# Patient Record
Sex: Female | Born: 1960 | Race: Black or African American | Hispanic: No | Marital: Married | Smoking: Never smoker
Health system: Southern US, Community
[De-identification: ages and names within clinical notes are randomized; demographics above are authoritative.]

## PROBLEM LIST (undated history)

## (undated) DIAGNOSIS — E785 Hyperlipidemia, unspecified: Secondary | ICD-10-CM

## (undated) DIAGNOSIS — E079 Disorder of thyroid, unspecified: Secondary | ICD-10-CM

## (undated) DIAGNOSIS — E119 Type 2 diabetes mellitus without complications: Secondary | ICD-10-CM

## (undated) DIAGNOSIS — I10 Essential (primary) hypertension: Secondary | ICD-10-CM

## (undated) HISTORY — DX: Type 2 diabetes mellitus without complications: E11.9

## (undated) HISTORY — DX: Hyperlipidemia, unspecified: E78.5

## (undated) HISTORY — DX: Essential (primary) hypertension: I10

## (undated) HISTORY — DX: Disorder of thyroid, unspecified: E07.9

---

## 2014-12-09 ENCOUNTER — Ambulatory Visit (INDEPENDENT_AMBULATORY_CARE_PROVIDER_SITE_OTHER): Admitting: Family Medicine

## 2014-12-09 VITALS — BP 130/84 | HR 75 | Temp 97.9°F | Resp 16 | Ht 63.5 in | Wt 182.0 lb

## 2014-12-09 DIAGNOSIS — H01003 Unspecified blepharitis right eye, unspecified eyelid: Secondary | ICD-10-CM

## 2014-12-09 DIAGNOSIS — H00023 Hordeolum internum right eye, unspecified eyelid: Secondary | ICD-10-CM

## 2014-12-09 MED ORDER — AMOXICILLIN-POT CLAVULANATE 875-125 MG PO TABS: 1.0000 | ORAL_TABLET | Freq: Two times a day (BID) | ORAL | Status: DC

## 2014-12-09 MED ORDER — OFLOXACIN 0.3 % OP SOLN: 1.0000 [drp] | Freq: Four times a day (QID) | OPHTHALMIC | Status: DC

## 2014-12-09 NOTE — Patient Instructions (Signed)
Take the Augmentin (amoxicillin/clavulanate) one pill twice daily for infection  If the eye itself starts to get regular use the ofloxacin eyedrops 2 drops 4 times daily  Return or get checked by your doctor if not improving  Keep your appointment with your eye doctor to get your visual acuity of the right eye recheck.

## 2014-12-09 NOTE — Progress Notes (Signed)
Patient ID: Miranda Mccullough, female    DOB: 06/07/1960  Age: 54 y.o. MRN: [id number]  Chief Complaint  Patient presents with  . Facial Swelling    Right eye, redness/ onset 4 days    Subjective:   54 year old lady who has a history of having had styes several times her left eye. She has had 2 days of swelling and irritation of the right upper eyelid. It was worse this morning. There is matting of the eyelids this morning. No other major concerns. She is a "prediabetic" on metformin.  Current allergies, medications, problem list, past/family and social histories reviewed.  Objective:  BP 130/84 mmHg  Pulse 75  Temp(Src) 97.9 F (36.6 C) (Oral)  Resp 16  Ht 5' 3.5" (1.613 m)  Wt 182 lb (82.555 kg)  BMI 31.73 kg/m2  SpO2 97%  LMP 06/02/2014  No acute distress. Eyes are PERRLA. Reflex bilaterally. Her conjunctiva is noninjected. She has a stye on the inner aspect of the upper eyelid right eye. It is not draining yet. The whole eyelid is swollen and red.  Assessment & Plan:   Assessment: 1. Sty, internal, right   2. Blepharitis of right eye       Plan: Treat with oral antibiotics. If he gets rid down to the conjunctiva she is to start using a bike eyedrops which I do not think will be needed. She was instructed to follow-up with her eye doctor. Will acuity was diminished.   Meds ordered this encounter  Medications  . valsartan-hydrochlorothiazide (DIOVAN-HCT) 160-12.5 MG tablet    Sig: Take 1 tablet by mouth daily.  Marland Kitchen levothyroxine (SYNTHROID, LEVOTHROID) 150 MCG tablet    Sig: Take 150 mcg by mouth daily before breakfast.  . metFORMIN (GLUMETZA) 500 MG (MOD) 24 hr tablet    Sig: Take 500 mg by mouth daily with breakfast.  . cetirizine (ZYRTEC) 10 MG tablet    Sig: Take 10 mg by mouth daily.  Marland Kitchen aspirin 81 MG tablet    Sig: Take 81 mg by mouth daily.  Marland Kitchen amLODipine (NORVASC) 10 MG tablet    Sig: Take 10 mg by mouth 2 (two) times daily.  Marland Kitchen amoxicillin-clavulanate  (AUGMENTIN) 875-125 MG tablet    Sig: Take 1 tablet by mouth 2 (two) times daily.    Dispense:  20 tablet    Refill:  0  . ofloxacin (OCUFLOX) 0.3 % ophthalmic solution    Sig: Place 1 drop into the right eye 4 (four) times daily.    Dispense:  5 mL    Refill:  0         Patient Instructions  Take the Augmentin (amoxicillin/clavulanate) one pill twice daily for infection  If the eye itself starts to get regular use the ofloxacin eyedrops 2 drops 4 times daily  Return or get checked by your doctor if not improving  Keep your appointment with your eye doctor to get your visual acuity of the right eye recheck.     Return if symptoms worsen or fail to improve.   HOPPER,DAVID, MD 12/09/2014

## 2017-03-31 ENCOUNTER — Other Ambulatory Visit: Payer: Self-pay | Admitting: Family Medicine

## 2017-03-31 DIAGNOSIS — Z1231 Encounter for screening mammogram for malignant neoplasm of breast: Secondary | ICD-10-CM

## 2017-04-23 ENCOUNTER — Ambulatory Visit
Admission: RE | Admit: 2017-04-23 | Discharge: 2017-04-23 | Disposition: A | Discharge status: Home / Self Care | Source: Ambulatory Visit | Attending: Family Medicine | Admitting: Family Medicine

## 2017-04-23 DIAGNOSIS — Z1231 Encounter for screening mammogram for malignant neoplasm of breast: Secondary | ICD-10-CM | POA: Diagnosis not present

## 2017-05-04 ENCOUNTER — Inpatient Hospital Stay
Admission: RE | Admit: 2017-05-04 | Discharge: 2017-05-04 | Disposition: A | Discharge status: Home / Self Care | Payer: Self-pay | Source: Ambulatory Visit | Attending: *Deleted | Admitting: *Deleted

## 2017-05-04 ENCOUNTER — Other Ambulatory Visit: Payer: Self-pay | Admitting: *Deleted

## 2017-05-04 DIAGNOSIS — Z9289 Personal history of other medical treatment: Secondary | ICD-10-CM

## 2018-01-16 LAB — TSH: TSH: 0.2 — AB (ref <=5.90)

## 2018-03-29 ENCOUNTER — Ambulatory Visit (INDEPENDENT_AMBULATORY_CARE_PROVIDER_SITE_OTHER): Admitting: "Endocrinology

## 2018-03-29 ENCOUNTER — Encounter: Payer: Self-pay | Admitting: "Endocrinology

## 2018-03-29 VITALS — BP 138/84 | HR 79 | Ht 63.5 in | Wt 165.0 lb

## 2018-03-29 DIAGNOSIS — E890 Postprocedural hypothyroidism: Secondary | ICD-10-CM | POA: Diagnosis not present

## 2018-03-29 DIAGNOSIS — E039 Hypothyroidism, unspecified: Secondary | ICD-10-CM | POA: Insufficient documentation

## 2018-03-29 MED ORDER — LEVOTHYROXINE SODIUM 125 MCG PO TABS: 125.0000 ug | ORAL_TABLET | Freq: Every day | ORAL | 1 refills | Status: DC

## 2018-03-29 NOTE — Progress Notes (Signed)
Endocrinology Consult Note                                         03/29/2018, 3:16 PM   Miranda Mccullough is a 58 y.o.-year-old female patient being seen in consultation for hypothyroidism referred by Alisia Ferrari, NP.   Past Medical History:  Diagnosis Date  . Diabetes mellitus without complication (HCC)   . Hyperlipidemia   . Hypertension   . Thyroid disease    Past Surgical History:  Procedure Laterality Date  . CESAREAN SECTION     Social History   Socioeconomic History  . Marital status: Married    Spouse name: Not on file  . Number of children: Not on file  . Years of education: Not on file  . Highest education level: Not on file  Occupational History  . Not on file  Social Needs  . Financial resource strain: Not on file  . Food insecurity:    Worry: Not on file    Inability: Not on file  . Transportation needs:    Medical: Not on file    Non-medical: Not on file  Tobacco Use  . Smoking status: Never Smoker  . Smokeless tobacco: Never Used  Substance and Sexual Activity  . Alcohol use: Yes    Alcohol/week: 1.0 - 2.0 standard drinks    Types: 1 - 2 Standard drinks or equivalent per week  . Drug use: No  . Sexual activity: Not on file  Lifestyle  . Physical activity:    Days per week: Not on file    Minutes per session: Not on file  . Stress: Not on file  Relationships  . Social connections:    Talks on phone: Not on file    Gets together: Not on file    Attends religious service: Not on file    Active member of club or organization: Not on file    Attends meetings of clubs or organizations: Not on file    Relationship status: Not on file  Other Topics Concern  . Not on file  Social History Narrative  . Not on file   Outpatient Encounter Medications as of 03/29/2018  Medication Sig  . amLODipine (NORVASC) 10 MG tablet Take 10 mg by mouth 2 (two) times daily.  .  cetirizine (ZYRTEC) 10 MG tablet Take 10 mg by mouth daily.  Marland Kitchen levothyroxine (SYNTHROID, LEVOTHROID) 125 MCG tablet Take 1 tablet (125 mcg total) by mouth daily before breakfast.  . rosuvastatin (CRESTOR) 40 MG tablet Take 40 mg by mouth daily.  . sitaGLIPtin-metformin (JANUMET) 50-1000 MG tablet Take 1 tablet by mouth 2 (two) times daily with a meal.  . telmisartan-hydrochlorothiazide (MICARDIS HCT) 80-25 MG tablet Take 1 tablet by mouth daily.  . Vitamin D, Ergocalciferol, (DRISDOL) 1.25 MG (50000 UT) CAPS capsule Take 50,000 Units by mouth every 7 (seven) days.  . [DISCONTINUED] levothyroxine (SYNTHROID, LEVOTHROID) 100 MCG tablet Take 100 mcg by  mouth daily before breakfast.  . [DISCONTINUED] amoxicillin-clavulanate (AUGMENTIN) 875-125 MG tablet Take 1 tablet by mouth 2 (two) times daily.  . [DISCONTINUED] aspirin 81 MG tablet Take 81 mg by mouth daily.  . [DISCONTINUED] levothyroxine (SYNTHROID, LEVOTHROID) 150 MCG tablet Take 150 mcg by mouth daily before breakfast.  . [DISCONTINUED] metFORMIN (GLUMETZA) 500 MG (MOD) 24 hr tablet Take 500 mg by mouth daily with breakfast.  . [DISCONTINUED] ofloxacin (OCUFLOX) 0.3 % ophthalmic solution Place 1 drop into the right eye 4 (four) times daily.  . [DISCONTINUED] valsartan-hydrochlorothiazide (DIOVAN-HCT) 160-12.5 MG tablet Take 1 tablet by mouth daily.   No facility-administered encounter medications on file as of 03/29/2018.    ALLERGIES: Allergies  Allergen Reactions  . Septra [Sulfamethoxazole-Trimethoprim] Nausea And Vomiting   VACCINATION STATUS:  There is no immunization history on file for this patient.   HPI    Miranda Mccullough  is a patient with the above medical history. she was diagnosed  with hypothyroidism at approximate age of 30 years when she received I-131 thyroid ablation [place] Graves' [place] with subsequent initiation of thyroid hormone replacement.    -  she was given various doses of Synthroid over the years,  currently on 100 micrograms. she reports that prior to her last lab work in November 2019 she was on 150 mcg.  She did have symptoms including unintended weight loss, palpitations, tremors, heat intolerance.  She underwent lab work including TSH which was found to be suppressed.  Her subsequent thyroid hormone dose was lowered.   She denies palpitations, tremors, heat intolerance at this time.   She has family history of thyroid dysfunction in her siblings and maternal aunts.   -She reports compliance to her medication approximately 95% of the time. -He denies weight gain, constipation, cold intolerance. -Her most recent thyroid function test on January 16, 2018 showed suppressed TSH of 0.201, high normal free T4 of 1.77.  Pt denies feeling nodules in neck, hoarseness, dysphagia/odynophagia, SOB with lying down.  No recent use of iodine supplements.  I reviewed her chart and she also has a history of type 2 diabetes well controlled on Janumet.   ROS:  Constitutional: +  weight loss, no fatigue, + subjective hyperthermia, no subjective hypothermia Eyes: no blurry vision, no xerophthalmia ENT: no sore throat, no nodules palpated in throat, no dysphagia/odynophagia, no hoarseness Cardiovascular: no Chest Pain, no Shortness of Breath, + palpitations, no leg swelling Respiratory: no cough, no SOB Gastrointestinal: no Nausea/Vomiting/Diarhhea Musculoskeletal: no muscle/joint aches Skin: no rashes Neurological: no tremors, no numbness, no tingling, no dizziness Psychiatric: no depression, no anxiety   Physical Exam: BP 138/84   Pulse 79   Ht 5' 3.5" (1.613 m)   Wt 165 lb (74.8 kg)   LMP 06/02/2014   BMI 28.77 kg/m  Wt Readings from Last 3 Encounters:  03/29/18 165 lb (74.8 kg)  12/09/14 182 lb (82.6 kg)    Constitutional: + Over weight for height, not in acute distress, normal state of mind Eyes: PERRLA, EOMI, no exophthalmos ENT: moist mucous membranes, no thyromegaly, no  cervical lymphadenopathy Cardiovascular: normal precordial activity, Regular Rate and Rhythm, no Murmur/Rubs/Gallops Respiratory:  adequate breathing efforts, no gross chest deformity, Clear to auscultation bilaterally Gastrointestinal: abdomen soft, Non -tender, No distension, Bowel Sounds present Musculoskeletal: no gross deformities, strength intact in all four extremities Skin: moist, warm, no rashes Neurological: no tremor with outstretched hands, Deep tendon reflexes normal in all four extremities.    ASSESSMENT: 1. 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  I-131 therapy was administered at approximate age of 30 years and overseas hospital in [place].  On physical exam , patient  does not  have  gross goiter, thyroid nodules, or neck compression symptoms.  Her symptoms from iatrogenic thyrotoxicosis have largely subsided when her Synthroid  dose was lowered from 150mcg to 100 mcg. -Based on her current body weight, her levothyroxine dose should be around 125 mcg p.o. nightly. -I discussed and prescribed Synthroid 125 mcg p.o. every morning. - We discussed about correct intake of levothyroxine, at fasting, with water, separated by at least 30 minutes from breakfast, and separated by more than 4 hours from calcium, iron, multivitamins, acid reflux medications (PPIs). -Patient is made aware of the fact that thyroid hormone replacement is needed for life, dose to be adjusted by periodic monitoring of thyroid function tests. -Due to absence of clinical goiter, no need for thyroid ultrasound.  - Time spent with the patient: 35 minutes, of which >50% was spent in obtaining information about her symptoms, reviewing her previous labs, evaluations, and treatments, counseling her about her hypothyroidism, and developing a plan to confirm the diagnosis and long term treatment as necessary.  Miranda Mccullough participated in the  discussions, expressed understanding, and voiced agreement with the above plans.  All questions were answered to her satisfaction. she is encouraged to contact clinic should she have any questions or concerns prior to her return visit.  Return in about 3 months (around 06/28/2018) for Follow up with Pre-visit Labs.   He wishes to follow-up for her diabetes with her PMD.  Miranda LunchGebre Alyssamae Klinck, MD [facility] [address] [place], [place] [address] Phone: [phone]  Fax: [phone]   03/29/2018, 3:16 PM  This note was partially dictated with voice recognition software. Similar sounding words can be transcribed inadequately or may not  be corrected upon review.

## 2018-05-20 ENCOUNTER — Encounter: Payer: Self-pay | Admitting: *Deleted

## 2018-06-28 ENCOUNTER — Ambulatory Visit: Admitting: "Endocrinology

## 2018-07-20 ENCOUNTER — Ambulatory Visit

## 2018-10-04 ENCOUNTER — Ambulatory Visit (INDEPENDENT_AMBULATORY_CARE_PROVIDER_SITE_OTHER): Payer: Self-pay | Admitting: *Deleted

## 2018-10-04 ENCOUNTER — Other Ambulatory Visit: Payer: Self-pay

## 2018-10-04 DIAGNOSIS — Z86010 Personal history of colonic polyps: Secondary | ICD-10-CM

## 2018-10-04 MED ORDER — NA SULFATE-K SULFATE-MG SULF 17.5-3.13-1.6 GM/177ML PO SOLN: 1.0000 | Freq: Once | ORAL | 0 refills | Status: AC

## 2018-10-04 NOTE — Progress Notes (Signed)
Gastroenterology Pre-Procedure Review  Request Date: 10/04/2018 Requesting Physician: Althea Grimmer, NP @ [facility], Last TCS done Jan 2015 at [facility], [place], 3 polyps removed per pt  PATIENT REVIEW QUESTIONS: The patient responded to the following health history questions as indicated:    1. Diabetes Melitis: Yes 2. Joint replacements in the past 12 months: No 3. Major health problems in the past 3 months: Yes, diabetes management,elevated bs 4. Has an artificial valve or MVP: No 5. Has a defibrillator: No 6. Has been advised in past to take antibiotics in advance of a procedure like teeth cleaning: No 7. Family history of colon cancer: No  8. Alcohol Use: Yes, 2 to 3 glasses of wine per week 9. History of sleep apnea: No  10. History of coronary artery or other vascular stents placed within the last 12 months: No 11. History of any prior anesthesia complications: Yes, 30 years ago, stiffness in knees     MEDICATIONS & ALLERGIES:    Patient reports the following regarding taking any blood thinners:   Plavix? No  Aspirin? No Coumadin? No Brilinta? No Xarelto? No Eliquis? No Pradaxa? No Savaysa? No Effient? No  Patient confirms/reports the following medications:  Current Outpatient Medications  Medication Sig Dispense Refill  . amLODipine (NORVASC) 10 MG tablet Take 10 mg by mouth 2 (two) times daily.    . cetirizine (ZYRTEC) 10 MG tablet Take 10 mg by mouth daily.    Marland Kitchen levothyroxine (SYNTHROID, LEVOTHROID) 125 MCG tablet Take 1 tablet (125 mcg total) by mouth daily before breakfast. 90 tablet 1  . rosuvastatin (CRESTOR) 40 MG tablet Take 40 mg by mouth daily.    . sitaGLIPtin-metformin (JANUMET) 50-1000 MG tablet Take 1 tablet by mouth 2 (two) times daily with a meal.    . telmisartan-hydrochlorothiazide (MICARDIS HCT) 80-25 MG tablet Take 1 tablet by mouth daily.    . Vitamin D, Ergocalciferol, (DRISDOL) 1.25 MG (50000 UT) CAPS  capsule Take 50,000 Units by mouth every 7 (seven) days.     No current facility-administered medications for this visit.     Patient confirms/reports the following allergies:  Allergies  Allergen Reactions  . Septra [Sulfamethoxazole-Trimethoprim] Nausea And Vomiting    No orders of the defined types were placed in this encounter.   AUTHORIZATION INFORMATION Primary Insurance: Dyanne Iha,  [place] #: [id number] Pre-Cert / Josem Kaufmann required: Not required  SCHEDULE INFORMATION: Procedure has been scheduled as follows:  Date: 11/05/2018, Time: 10:30 Location: [facility] with Dr. Oneida Alar  This Gastroenterology Pre-Precedure Review Form is being routed to the following provider(s): Neil Crouch, PA-C

## 2018-10-04 NOTE — Patient Instructions (Signed)
Miranda Mccullough  1960-12-17 MRN: [id number]     Procedure Date: 11/05/2018 Time to register: 9:30 am Place to register: [facility] Procedure Time: 10:30 am Scheduled provider: Dr. Oneida Alar    PREPARATION FOR COLONOSCOPY WITH SUPREP BOWEL PREP KIT  Note: Suprep Bowel Prep Kit is a split-dose (2day) regimen. Consumption of BOTH 6-ounce bottles is required for a complete prep.  Please notify us immediately if you are diabetic, take iron supplements, or if you are on Coumadin or any other blood thinners.  Please hold the following medications: see letter                                                                                                                                                 1 DAY BEFORE PROCEDURE:  DATE: 11/04/2018 DAY: Thursday Continue clear liquids the entire day - NO SOLID FOOD.   Diabetic medications adjustments for today: see letter  At 6:00pm: Complete steps 1 through 4 below, using ONE (1) 6-ounce bottle, before going to bed. Step 1:  Pour ONE (1) 6-ounce bottle of SUPREP liquid into the mixing container.  Step 2:  Add cool drinking water to the 16 ounce line on the container and mix.  Note: Dilute the solution concentrate as directed prior to use. Step 3:  DRINK ALL the liquid in the container. Step 4:  You MUST drink an additional two (2) or more 16 ounce containers of water over the next one (1) hour.   Continue clear liquids.  DAY OF PROCEDURE:   DATE: 11/05/2018   DAY: Friday If you take medications for your heart, blood pressure, or breathing, you may take these medications.  Diabetic medications adjustments for today: see letter  5 hours before your procedure at :  5:30 am Step 1:  Pour ONE (1) 6-ounce bottle of SUPREP liquid into the mixing container.  Step 2:  Add cool drinking water to the 16 ounce line on the container and mix.  Note: Dilute the solution concentrate as directed prior to use. Step 3:  DRINK ALL the liquid in the  container. Step 4:  You MUST drink an additional two (2) or more 16 ounce containers of water over the next one (1) hour. You MUST complete the final glass of water at least 3 hours before your colonoscopy. Nothing by mouth past 7:30 am  You may take your morning medications with sip of water unless we have instructed otherwise.    Please see below for Dietary Information.  CLEAR LIQUIDS INCLUDE:  Water Jello (NOT red in color)   Ice Popsicles (NOT red in color)   Tea (sugar ok, no milk/cream) Powdered fruit flavored drinks  Coffee (sugar ok, no milk/cream) Gatorade/ Lemonade/ Kool-Aid  (NOT red in color)   Juice: apple, white grape, white cranberry Soft drinks  Clear bullion, consomme, broth (fat  free beef/chicken/vegetable)  Carbonated beverages (any kind)  Strained chicken noodle soup Hard Candy   Remember: Clear liquids are liquids that will allow you to see your fingers on the other side of a clear glass. Be sure liquids are NOT red in color, and not cloudy, but CLEAR.  DO NOT EAT OR DRINK ANY OF THE FOLLOWING:  Dairy products of any kind   Cranberry juice Tomato juice / V8 juice   Grapefruit juice Orange juice     Red grape juice  Do not eat any solid foods, including such foods as: cereal, oatmeal, yogurt, fruits, vegetables, creamed soups, eggs, bread, crackers, pureed foods in a blender, etc.   HELPFUL HINTS FOR DRINKING PREP SOLUTION:   Make sure prep is extremely cold. Mix and refrigerate the the morning of the prep. You may also put in the freezer.   You may try mixing some Crystal Light or Country Time Lemonade if you prefer. Mix in small amounts; add more if necessary.  Try drinking through a straw  Rinse mouth with water or a mouthwash between glasses, to remove after-taste.  Try sipping on a cold beverage /ice/ popsicles between glasses of prep.  Place a piece of sugar-free hard candy in mouth between glasses.  If you become nauseated, try consuming smaller  amounts, or stretch out the time between glasses. Stop for 30-60 minutes, then slowly start back drinking.     OTHER INSTRUCTIONS  You will need a responsible adult at least 59 years of age to accompany you and drive you home. This person must remain in the waiting room during your procedure. The hospital will cancel your procedure if you do not have a responsible adult with you.   1. Wear loose fitting clothing that is easily removed. 2. Leave jewelry and other valuables at home.  3. Remove all body piercing jewelry and leave at home. 4. Total time from sign-in until discharge is approximately 2-3 hours. 5. You should go home directly after your procedure and rest. You can resume normal activities the day after your procedure. 6. The day of your procedure you should not:  Drive  Make legal decisions  Operate machinery  Drink alcohol  Return to work   You may call the office (Dept: [phone]) before 5:00pm, or page the doctor on call [phone]) after 5:00pm, for further instructions, if necessary.   Insurance Information YOU WILL NEED TO CHECK WITH YOUR INSURANCE COMPANY FOR THE BENEFITS OF COVERAGE YOU HAVE FOR THIS PROCEDURE.  UNFORTUNATELY, NOT ALL INSURANCE COMPANIES HAVE BENEFITS TO COVER ALL OR PART OF THESE TYPES OF PROCEDURES.  IT IS YOUR RESPONSIBILITY TO CHECK YOUR BENEFITS, HOWEVER, WE WILL BE GLAD TO ASSIST YOU WITH ANY CODES YOUR INSURANCE COMPANY MAY NEED.    PLEASE NOTE THAT MOST INSURANCE COMPANIES WILL NOT COVER A SCREENING COLONOSCOPY FOR PEOPLE UNDER THE AGE OF 50  IF YOU HAVE BCBS INSURANCE, YOU MAY HAVE BENEFITS FOR A SCREENING COLONOSCOPY BUT IF POLYPS ARE FOUND THE DIAGNOSIS WILL CHANGE AND THEN YOU MAY HAVE A DEDUCTIBLE THAT WILL NEED TO BE MET. SO PLEASE MAKE SURE YOU CHECK YOUR BENEFITS FOR A SCREENING COLONOSCOPY AS WELL AS A DIAGNOSTIC COLONOSCOPY.

## 2018-10-06 NOTE — Progress Notes (Signed)
  OK to schedule.  Day before TCS: Janumet 1/2 tab bid AM of TCS: Hold Janumet

## 2018-10-07 ENCOUNTER — Encounter: Payer: Self-pay | Admitting: *Deleted

## 2018-10-07 NOTE — Addendum Note (Signed)
Addended by: Metro Kung on: 10/07/2018 03:36 PM   Modules accepted: Orders, SmartSet

## 2018-10-07 NOTE — Progress Notes (Signed)
Mailed letter to pt about diabetes medication adjustments.

## 2018-11-02 ENCOUNTER — Other Ambulatory Visit (HOSPITAL_COMMUNITY)
Admission: RE | Admit: 2018-11-02 | Discharge: 2018-11-02 | Disposition: A | Discharge status: Home / Self Care | Source: Ambulatory Visit | Attending: Gastroenterology | Admitting: Gastroenterology

## 2018-11-02 ENCOUNTER — Other Ambulatory Visit: Payer: Self-pay

## 2018-11-02 DIAGNOSIS — Z20828 Contact with and (suspected) exposure to other viral communicable diseases: Secondary | ICD-10-CM | POA: Insufficient documentation

## 2018-11-02 DIAGNOSIS — Z01812 Encounter for preprocedural laboratory examination: Secondary | ICD-10-CM | POA: Diagnosis not present

## 2018-11-02 LAB — SARS CORONAVIRUS 2 (TAT 6-24 HRS): SARS Coronavirus 2: NEGATIVE

## 2018-11-03 ENCOUNTER — Other Ambulatory Visit (HOSPITAL_COMMUNITY)

## 2018-11-03 NOTE — Progress Notes (Signed)
CC'D TO PCP °

## 2018-11-04 ENCOUNTER — Telehealth: Payer: Self-pay | Admitting: *Deleted

## 2018-11-04 ENCOUNTER — Other Ambulatory Visit (HOSPITAL_COMMUNITY): Admission: RE | Admit: 2018-11-04 | Source: Ambulatory Visit

## 2018-11-04 NOTE — Telephone Encounter (Signed)
Pt would like you to call her back at [phone].

## 2018-11-04 NOTE — Telephone Encounter (Signed)
Pt is aware of results of Covid ( negative).

## 2018-11-05 ENCOUNTER — Other Ambulatory Visit: Payer: Self-pay

## 2018-11-05 ENCOUNTER — Encounter (HOSPITAL_COMMUNITY): Payer: Self-pay | Admitting: Emergency Medicine

## 2018-11-05 ENCOUNTER — Ambulatory Visit (HOSPITAL_COMMUNITY)
Admission: RE | Admit: 2018-11-05 | Discharge: 2018-11-05 | Disposition: A | Discharge status: Home / Self Care | Attending: Gastroenterology | Admitting: Gastroenterology

## 2018-11-05 ENCOUNTER — Encounter (HOSPITAL_COMMUNITY)
Admission: RE | Disposition: A | Discharge status: Home / Self Care | Payer: Self-pay | Source: Home / Self Care | Attending: Gastroenterology

## 2018-11-05 DIAGNOSIS — Z1211 Encounter for screening for malignant neoplasm of colon: Secondary | ICD-10-CM | POA: Insufficient documentation

## 2018-11-05 DIAGNOSIS — Z8249 Family history of ischemic heart disease and other diseases of the circulatory system: Secondary | ICD-10-CM | POA: Diagnosis not present

## 2018-11-05 DIAGNOSIS — Z79890 Hormone replacement therapy (postmenopausal): Secondary | ICD-10-CM | POA: Diagnosis not present

## 2018-11-05 DIAGNOSIS — E119 Type 2 diabetes mellitus without complications: Secondary | ICD-10-CM | POA: Diagnosis not present

## 2018-11-05 DIAGNOSIS — E785 Hyperlipidemia, unspecified: Secondary | ICD-10-CM | POA: Diagnosis not present

## 2018-11-05 DIAGNOSIS — K648 Other hemorrhoids: Secondary | ICD-10-CM | POA: Diagnosis not present

## 2018-11-05 DIAGNOSIS — K644 Residual hemorrhoidal skin tags: Secondary | ICD-10-CM | POA: Insufficient documentation

## 2018-11-05 DIAGNOSIS — I10 Essential (primary) hypertension: Secondary | ICD-10-CM | POA: Diagnosis not present

## 2018-11-05 DIAGNOSIS — Z79899 Other long term (current) drug therapy: Secondary | ICD-10-CM | POA: Insufficient documentation

## 2018-11-05 DIAGNOSIS — Z860100 Personal history of colon polyps, unspecified: Secondary | ICD-10-CM

## 2018-11-05 DIAGNOSIS — Z7984 Long term (current) use of oral hypoglycemic drugs: Secondary | ICD-10-CM | POA: Insufficient documentation

## 2018-11-05 DIAGNOSIS — K5730 Diverticulosis of large intestine without perforation or abscess without bleeding: Secondary | ICD-10-CM | POA: Diagnosis not present

## 2018-11-05 DIAGNOSIS — Z8719 Personal history of other diseases of the digestive system: Secondary | ICD-10-CM | POA: Diagnosis present

## 2018-11-05 DIAGNOSIS — Z882 Allergy status to sulfonamides status: Secondary | ICD-10-CM | POA: Diagnosis not present

## 2018-11-05 DIAGNOSIS — E079 Disorder of thyroid, unspecified: Secondary | ICD-10-CM | POA: Insufficient documentation

## 2018-11-05 HISTORY — PX: COLONOSCOPY: SHX5424

## 2018-11-05 LAB — GLUCOSE, CAPILLARY: Glucose-Capillary: 150 mg/dL — ABNORMAL HIGH (ref 70–99)

## 2018-11-05 SURGERY — COLONOSCOPY
Anesthesia: Moderate Sedation

## 2018-11-05 MED ORDER — MEPERIDINE HCL 100 MG/ML IJ SOLN
INTRAMUSCULAR | Status: AC
  Filled 2018-11-05: qty 2

## 2018-11-05 MED ORDER — STERILE WATER FOR IRRIGATION IR SOLN
Status: DC | PRN
  Administered 2018-11-05: 11:00:00 2.5 mL

## 2018-11-05 MED ORDER — SODIUM CHLORIDE 0.9 % IV SOLN
INTRAVENOUS | Status: DC
  Administered 2018-11-05: 10:00:00 via INTRAVENOUS

## 2018-11-05 MED ORDER — MIDAZOLAM HCL 5 MG/5ML IJ SOLN
INTRAMUSCULAR | Status: AC
  Filled 2018-11-05: qty 10

## 2018-11-05 MED ORDER — MEPERIDINE HCL 100 MG/ML IJ SOLN
INTRAMUSCULAR | Status: DC | PRN
  Administered 2018-11-05 (×2): 25 mg via INTRAVENOUS

## 2018-11-05 MED ORDER — MIDAZOLAM HCL 5 MG/5ML IJ SOLN
INTRAMUSCULAR | Status: DC | PRN
  Administered 2018-11-05 (×2): 2 mg via INTRAVENOUS
  Administered 2018-11-05: 1 mg via INTRAVENOUS

## 2018-11-05 NOTE — Discharge Instructions (Signed)
You DID NOT HAVE ANY POLYPS. YOU HAVE DIVERTICULOSIS IN YOUR TRANSVERSE COLON. You have internal AND EXTERNAL hemorrhoids.   DRINK WATER TO KEEP URINE LIGHT YELLOW.  FOLLOW A HIGH FIBER DIET. AVOID ITEMS THAT CAUSE BLOATING & GAS. SEE INFO BELOW.  IF YOU ARE INTERESTED IN GETTING OFF BP PILLS AND CHOLESTEROL/DIABETES MEDS, CONSIDER TRANSITIONING TO A DETOX DIET. THE BOOK IS AUTHORED BY DR. MARK HYMAN, "10 DAY DETOX DIET". EAT TO LIVE AND THINK OF FOOD AS MEDICINE. 75% OF YOUR PLATE SHOULD BE FRUITS/VEGGIES AND 25%OF YOUR PLATE SHOULD BE A PROTEIN SOURCE.  To have more energy and to REDUCE BLOOD PRESSURE AND NEED FOR CHOLESTEROL/DIABETES MEDS:      1. START TAKING A MULTIVITAMIN AND VITAMIN B12 DAILY. CONTINUE VITAMIN D3.    2. If you must eat bread, EAT EZEKIEL BREAD. IT IS IN THE FROZEN SECTION OF THE GROCERY STORE.    3. Do not drink SODA, GATORADE, ENERGY DRINKS, OR DIET SODA.     4. AVOID HIGH FRUCTOSE CORN SYRUP.     5. DO NOT chew SUGAR FREE GUM OR USE ARTIFICIAL SWEETENERS. USE STEVIA AS A SWEETENER.    6. DO NOT EAT ENRICHED WHEAT FLOUR, PASTA, RICE, OR CEREAL.    7. ONLY EAT WILD CAUGHT SEAFOOD, GRASS FED BEEF OR CHICKEN, FREE RANGE PORK, OR EGGS FROM PASTURE RAISED CHICKENS.    8. PRACTICE CHAIR YOGA FOR 15-30 MINS 3 OR 4 TIMES A WEEK AND PROGRESS TO HATHA YOGA OVER NEXT 6 MOS.   USE PREPARATION H FOUR TIMES  A DAY IF NEEDED TO RELIEVE RECTAL PAIN/PRESSURE/BLEEDING.   Next colonoscopy in 5 years.   Colonoscopy Care After Read the instructions outlined below and refer to this sheet in the next week. These discharge instructions provide you with general information on caring for yourself after you leave the hospital. While your treatment has been planned according to the most current medical practices available, unavoidable complications occasionally occur. If you have any problems or questions after discharge, call DR. Doye Montilla, [phone].  ACTIVITY  You may resume  your regular activity, but move at a slower pace for the next 24 hours.   Take frequent rest periods for the next 24 hours.   Walking will help get rid of the air and reduce the bloated feeling in your belly (abdomen).   No driving for 24 hours (because of the medicine (anesthesia) used during the test).   You may shower.   Do not sign any important legal documents or operate any machinery for 24 hours (because of the anesthesia used during the test).    NUTRITION  Drink plenty of fluids.   You may resume your normal diet as instructed by your doctor.   Begin with a light meal and progress to your normal diet. Heavy or fried foods are harder to digest and may make you feel sick to your stomach (nauseated).   Avoid alcoholic beverages for 24 hours or as instructed.    MEDICATIONS  You may resume your normal medications.   WHAT YOU CAN EXPECT TODAY  Some feelings of bloating in the abdomen.   Passage of more gas than usual.   Spotting of blood in your stool or on the toilet paper  .  IF YOU HAD POLYPS REMOVED DURING THE COLONOSCOPY:  Eat a soft diet IF YOU HAVE NAUSEA, BLOATING, ABDOMINAL PAIN, OR VOMITING.    FINDING OUT THE RESULTS OF YOUR TEST Not all test results are available during your  visit. DR. Oneida Alar WILL CALL YOU WITHIN 7 DAYS OF YOUR PROCEDUE WITH YOUR RESULTS. Do not assume everything is normal if you have not heard from DR. Finis Hendricksen IN ONE WEEK, CALL HER OFFICE AT [phone].  SEEK IMMEDIATE MEDICAL ATTENTION AND CALL THE OFFICE: [phone] IF:  You have more than a spotting of blood in your stool.   Your belly is swollen (abdominal distention).   You are nauseated or vomiting.   You have a temperature over 101F.   You have abdominal pain or discomfort that is severe or gets worse throughout the day.  High-Fiber Diet A high-fiber diet changes your normal diet to include more whole grains, legumes, fruits, and vegetables. Changes in the diet  involve replacing refined carbohydrates with unrefined foods. The calorie level of the diet is essentially unchanged. The Dietary Reference Intake (recommended amount) for adult males is 38 grams per day. For adult females, it is 25 grams per day. Pregnant and lactating women should consume 28 grams of fiber per day. Fiber is the intact part of a plant that is not broken down during digestion. Functional fiber is fiber that has been isolated from the plant to provide a beneficial effect in the body.  PURPOSE  Increase stool bulk.   Ease and regulate bowel movements.   Lower cholesterol.   REDUCE RISK OF COLON CANCER  INDICATIONS THAT YOU NEED MORE FIBER  Constipation and hemorrhoids.   Uncomplicated diverticulosis (intestine condition) and irritable bowel syndrome.   Weight management.   As a protective measure against hardening of the arteries (atherosclerosis), diabetes, and cancer.   GUIDELINES FOR INCREASING FIBER IN THE DIET  Start adding fiber to the diet slowly. A gradual increase of about 5 more grams (2 servings of most fruits or vegetables) per day is best. Too rapid an increase in fiber may result in constipation, flatulence, and bloating.   Drink enough water and fluids to keep your urine clear or pale yellow. Water, juice, or caffeine-free drinks are recommended. Not drinking enough fluid may cause constipation.   Eat a variety of high-fiber foods rather than one type of fiber.   Try to increase your intake of fiber through using high-fiber foods rather than fiber pills or supplements that contain small amounts of fiber.   The goal is to change the types of food eaten. Do not supplement your present diet with high-fiber foods, but replace foods in your present diet.     Diverticulosis Diverticulosis is a common condition that develops when small pouches (diverticula) form in the wall of the colon. The risk of diverticulosis increases with age. It happens more often  in people who eat a low-fiber diet. Most individuals with diverticulosis have no symptoms. Those individuals with symptoms usually experience belly (abdominal) pain, constipation, or loose stools (diarrhea).  HOME CARE INSTRUCTIONS  Increase the amount of fiber in your diet as directed by your caregiver or dietician. This may reduce symptoms of diverticulosis.   Drink at least 6 to 8 glasses of water each day to prevent constipation.   Try not to strain when you have a bowel movement.   THERE IS NO NEED TO Avoid nuts and seeds to prevent complications.   FOODS HAVING HIGH FIBER CONTENT INCLUDE:  Fruits. Apple, peach, pear, tangerine, raisins, prunes.   Vegetables. Brussels sprouts, asparagus, broccoli, cabbage, carrot, cauliflower, romaine lettuce, spinach, summer squash, tomato, winter squash, zucchini.   Starchy Vegetables. Baked beans, kidney beans, lima beans, split peas, lentils, potatoes (with skin).  Hemorrhoids Hemorrhoids are dilated (enlarged) veins around the rectum. Sometimes clots will form in the veins. This makes them swollen and painful. These are called thrombosed hemorrhoids. Causes of hemorrhoids include:  Constipation.   Straining to have a bowel movement.   HEAVY LIFTING  HOME CARE INSTRUCTIONS  Eat a well balanced diet and drink 6 to 8 glasses of water every day to avoid constipation. You may also use a bulk laxative.   Avoid straining to have bowel movements.   Keep anal area dry and clean.   Do not use a donut shaped pillow or sit on the toilet for long periods. This increases blood pooling and pain.   Move your bowels when your body has the urge; this will require less straining and will decrease pain and pressure.

## 2018-11-05 NOTE — H&P (Signed)
Primary Care Physician:  Alisia FerrariGill, Pushpinder K, NP Primary Gastroenterologist:  Dr. Darrick PennaFields  Pre-Procedure History & Physical: HPI:  Miranda Mccullough is a 58 y.o. female here for  PERSONAL HISTORY OF POLYPS.  Past Medical History:  Diagnosis Date  . Diabetes mellitus without complication (HCC)   . Hyperlipidemia   . Hypertension   . Thyroid disease     Past Surgical History:  Procedure Laterality Date  . CESAREAN SECTION      Prior to Admission medications   Medication Sig Start Date End Date Taking? Authorizing Provider  amLODipine (NORVASC) 10 MG tablet Take 10 mg by mouth 2 (two) times daily.   Yes [provider]  BLACK CURRANT SEED OIL PO Take 1 capsule by mouth daily.   Yes [provider]  cetirizine (ZYRTEC) 10 MG tablet Take 10 mg by mouth daily as needed for allergies.    Yes [provider]  levothyroxine (SYNTHROID) 100 MCG tablet Take 100 mcg by mouth daily before breakfast.   Yes [provider]  Polyethyl Glycol-Propyl Glycol (SYSTANE OP) Place 1 drop into both eyes daily as needed (dry eyes).   Yes [provider]  rosuvastatin (CRESTOR) 40 MG tablet Take 40 mg by mouth at bedtime.    Yes [provider]  SitaGLIPtin-MetFORMIN HCl (JANUMET XR) 50-1000 MG TB24 Take 1 tablet by mouth 2 (two) times daily.   Yes [provider]  telmisartan-hydrochlorothiazide (MICARDIS HCT) 80-25 MG tablet Take 1 tablet by mouth daily.   Yes [provider]  TURMERIC PO Take 1 capsule by mouth daily.   Yes [provider]  Vitamin D, Ergocalciferol, (DRISDOL) 1.25 MG (50000 UT) CAPS capsule Take 50,000 Units by mouth every Sunday.    Yes [provider]  levothyroxine (SYNTHROID, LEVOTHROID) 125 MCG tablet Take 1 tablet (125 mcg total) by mouth daily before breakfast. Patient not taking: Reported on 11/02/2018 03/29/18   Miranda KayserNida, [name], MD    Allergies as of 10/07/2018 - Review Complete 10/04/2018   Allergen Reaction Noted  . Septra [sulfamethoxazole-trimethoprim] Nausea And Vomiting 12/09/2014    Family History  Problem Relation Age of Onset  . Diabetes Mother   . Hyperlipidemia Mother   . Hypertension Mother   . Cancer Father   . Hyperlipidemia Father   . Hypertension Father   . Mental illness Father   . Heart disease Maternal Grandmother   . Mental illness Paternal Grandfather   . Breast cancer Neg Hx     Social History   Socioeconomic History  . Marital status: Married    Spouse name: Not on file  . Number of children: Not on file  . Years of education: Not on file  . Highest education level: Not on file  Occupational History  . Not on file  Social Needs  . Financial resource strain: Not on file  . Food insecurity    Worry: Not on file    Inability: Not on file  . Transportation needs    Medical: Not on file    Non-medical: Not on file  Tobacco Use  . Smoking status: Never Smoker  . Smokeless tobacco: Never Used  Substance and Sexual Activity  . Alcohol use: Yes    Alcohol/week: 1.0 - 2.0 standard drinks    Types: 1 - 2 Standard drinks or equivalent per week  . Drug use: No  . Sexual activity: Not on file  Lifestyle  . Physical activity    Days per week: Not on  file    Minutes per session: Not on file  . Stress: Not on file  Relationships  . Social Herbalist on phone: Not on file    Gets together: Not on file    Attends religious service: Not on file    Active member of club or organization: Not on file    Attends meetings of clubs or organizations: Not on file    Relationship status: Not on file  . Intimate partner violence    Fear of current or ex partner: Not on file    Emotionally abused: Not on file    Physically abused: Not on file    Forced sexual activity: Not on file  Other Topics Concern  . Not on file  Social History Narrative  . Not on file    Review of Systems: See HPI, otherwise negative ROS   Physical  Exam: BP (!) 138/95   Pulse 90   Temp 98.6 F (37 C) (Oral)   Resp 15   LMP 06/02/2014   SpO2 99%  General:   Alert,  pleasant and cooperative in NAD Head:  Normocephalic and atraumatic. Neck:  Supple; Lungs:  Clear throughout to auscultation.    Heart:  Regular rate and rhythm. Abdomen:  Soft, nontender and nondistended. Normal bowel sounds, without guarding, and without rebound.   Neurologic:  Alert and  oriented x4;  grossly normal neurologically.  Impression/Plan:      PERSONAL HISTORY OF POLYPS.  PLAN: 1. TCS TODAY. DISCUSSED PROCEDURE, BENEFITS, & RISKS: < 1% chance of medication reaction, bleeding, perforation, ASPIRATION, or rupture of spleen/liver requiring surgery to fix it and missed polyps < 1 cm 10-20% of the time.

## 2018-11-05 NOTE — Op Note (Signed)
[facility] Patient Name: Miranda NimrodMarceai Mccullough Procedure Date: 11/05/2018 10:47 AM MRN: [id number] Date of Birth: [birth year] Attending MD: Jonette EvaSandi Pia Jedlicka MD, MD CSN: [id number] Age: 58 Admit Type: Outpatient Procedure:                Colonoscopy, SURVEILLANCE Indications:              Personal history of colonic polyps Providers:                Jonette EvaSandi Ryken Paschal MD, MD, Nena PolioLisa Moore, RN, Pandora LeiterNeville David,                            Technician Referring MD:             Tana FeltsPushpinder Gill, NP Medicines:                Meperidine 50 mg IV, Midazolam 5 mg IV Complications:            No immediate complications. Estimated Blood Loss:     Estimated blood loss: none. Procedure:                Pre-Anesthesia Assessment:                           - Prior to the procedure, a History and Physical                            was performed, and patient medications and                            allergies were reviewed. The patient's tolerance of                            previous anesthesia was also reviewed. The risks                            and benefits of the procedure and the sedation                            options and risks were discussed with the patient.                            All questions were answered, and informed consent                            was obtained. Prior Anticoagulants: The patient has                            taken no previous anticoagulant or antiplatelet                            agents. ASA Grade Assessment: II - A patient with                            mild systemic disease. After reviewing the risks  and benefits, the patient was deemed in                            satisfactory condition to undergo the procedure.                            After obtaining informed consent, the colonoscope                            was passed under direct vision. Throughout the                            procedure, the patient's blood pressure, pulse, and                           oxygen saturations were monitored continuously. The                            [id number]-[id number] ([id number]) scope was introduced through                            the anus and advanced to the the cecum, identified                            by appendiceal orifice and ileocecal valve. The                            colonoscopy was somewhat difficult due to                            restricted mobility of the colon and a tortuous                            colon. Successful completion of the procedure was                            aided by increasing the dose of sedation                            medication, straightening and shortening the scope                            to obtain bowel loop reduction and COLOWRAP. The                            patient tolerated the procedure well. The ileocecal                            valve, appendiceal orifice, and rectum were                            photographed. The quality of the bowel preparation  was excellent. Scope In: 11:14:02 AM Scope Out: 11:26:40 AM Scope Withdrawal Time: 0 hours 10 minutes 27 seconds  Total Procedure Duration: 0 hours 12 minutes 38 seconds  Findings:      The recto-sigmoid colon, sigmoid colon and descending colon were mildly       tortuous.      Multiple small and large-mouthed diverticula were found in the       transverse colon.      External and internal hemorrhoids were found. Impression:               - Tortuous left colon.                           - TRANSVERSE COLON DIVERTICULOSIS                           - External AND INTERNAL HEMORRHOIDS. Moderate Sedation:      Moderate (conscious) sedation was administered by the endoscopy nurse       and supervised by the endoscopist. The following parameters were       monitored: oxygen saturation, heart rate, blood pressure, and response       to care. Total physician intraservice time was 25 minutes. Recommendation:            - Patient has a contact number available for                            emergencies. The signs and symptoms of potential                            delayed complications were discussed with the                            patient. Return to normal activities tomorrow.                            Written discharge instructions were provided to the                            patient.                           - Repeat colonoscopy in 5 years for surveillance.                           - High fiber diet. Change to detox diet.                           - START CHAIR YOGA. Procedure Code(s):        --- Professional ---                           [phone], Colonoscopy, flexible; diagnostic, including                            collection of specimen(s) by brushing or washing,  when performed (separate procedure)                           [id number], Moderate sedation; each additional 15                            minutes intraservice time                           G0500, Moderate sedation services provided by the                            same physician or other qualified health care                            professional performing a gastrointestinal                            endoscopic service that sedation supports,                            requiring the presence of an independent trained                            observer to assist in the monitoring of the                            patient's level of consciousness and physiological                            status; initial 15 minutes of intra-service time;                            patient age 58 years or older (additional time may                            be reported with [id number], as appropriate) Diagnosis Code(s):        --- Professional ---                           Z12.11, Encounter for screening for malignant                            neoplasm of colon                           K64.4, Residual  hemorrhoidal skin tags                           Q43.8, Other specified congenital malformations of                            intestine CPT copyright 2019 [employer]. All rights reserved. The codes documented in this report are preliminary and upon coder review may  be revised to meet current compliance requirements. Jonette EvaSandi Emmalyn Hinson, MD  Jonette Eva MD, MD 11/05/2018 1:29:29 PM This report has been signed electronically. Number of Addenda: 0

## 2018-11-11 ENCOUNTER — Encounter (HOSPITAL_COMMUNITY): Payer: Self-pay | Admitting: Gastroenterology

## 2018-11-25 ENCOUNTER — Ambulatory Visit (INDEPENDENT_AMBULATORY_CARE_PROVIDER_SITE_OTHER): Admitting: "Endocrinology

## 2018-11-25 ENCOUNTER — Other Ambulatory Visit: Payer: Self-pay

## 2018-11-25 ENCOUNTER — Encounter: Payer: Self-pay | Admitting: "Endocrinology

## 2018-11-25 DIAGNOSIS — E890 Postprocedural hypothyroidism: Secondary | ICD-10-CM | POA: Diagnosis not present

## 2018-11-25 MED ORDER — LEVOTHYROXINE SODIUM 100 MCG PO TABS: 100.0000 ug | ORAL_TABLET | Freq: Every day | ORAL | 1 refills | Status: DC

## 2018-11-25 NOTE — Progress Notes (Signed)
11/25/2018, 11:56 AM                                Endocrinology Telehealth Visit Follow up Note -During COVID -19 Pandemic  I connected with Miranda Mccullough on 11/25/2018   by telephone and verified that I am speaking with the correct person using two identifiers. Miranda Mccullough, 08/22/1960. she has verbally consented to this visit. All issues noted in this document were discussed and addressed. The format was not optimal for physical exam.   Miranda Mccullough is a 58 y.o.-year-old female patient being engaged in telehealth via telephone after she was seen in consultation for hypothyroidism. PMD:  Alisia FerrariGill, Pushpinder K, NP.   Past Medical History:  Diagnosis Date  . Diabetes mellitus without complication (HCC)   . Hyperlipidemia   . Hypertension   . Thyroid disease    Past Surgical History:  Procedure Laterality Date  . CESAREAN SECTION    . COLONOSCOPY N/A 11/05/2018   Procedure: COLONOSCOPY;  Surgeon: West BaliFields, Sandi L, MD;  [facility];  Service: Endoscopy;  Laterality: N/A;  10:30   Social History   Socioeconomic History  . Marital status: Married    Spouse name: Not on file  . Number of children: Not on file  . Years of education: Not on file  . Highest education level: Not on file  Occupational History  . Not on file  Social Needs  . Financial resource strain: Not on file  . Food insecurity    Worry: Not on file    Inability: Not on file  . Transportation needs    Medical: Not on file    Non-medical: Not on file  Tobacco Use  . Smoking status: Never Smoker  . Smokeless tobacco: Never Used  Substance and Sexual Activity  . Alcohol use: Yes    Alcohol/week: 1.0 - 2.0 standard drinks    Types: 1 - 2 Standard drinks or equivalent per week  . Drug use: No  . Sexual activity: Not on file  Lifestyle  . Physical activity    Days per week: Not on file   Minutes per session: Not on file  . Stress: Not on file  Relationships  . Social Musicianconnections    Talks on phone: Not on file    Gets together: Not on file    Attends religious service: Not on file    Active member of club or organization: Not on file    Attends meetings of clubs or organizations: Not on file    Relationship status: Not on file  Other Topics Concern  . Not on file  Social History Narrative  . Not on file   Outpatient Encounter Medications as of 11/25/2018  Medication Sig  . amLODipine (NORVASC) 10 MG tablet Take 10 mg  by mouth 2 (two) times daily.  Marland Kitchen BLACK CURRANT SEED OIL PO Take 1 capsule by mouth daily.  . cetirizine (ZYRTEC) 10 MG tablet Take 10 mg by mouth daily as needed for allergies.   Marland Kitchen levothyroxine (SYNTHROID) 100 MCG tablet Take 1 tablet (100 mcg total) by mouth daily before breakfast.  . Polyethyl Glycol-Propyl Glycol (SYSTANE OP) Place 1 drop into both eyes daily as needed (dry eyes).  . rosuvastatin (CRESTOR) 40 MG tablet Take 40 mg by mouth at bedtime.   . SitaGLIPtin-MetFORMIN HCl (JANUMET XR) 50-1000 MG TB24 Take 1 tablet by mouth 2 (two) times daily.  Marland Kitchen telmisartan-hydrochlorothiazide (MICARDIS HCT) 80-25 MG tablet Take 1 tablet by mouth daily.  . TURMERIC PO Take 1 capsule by mouth daily.  . Vitamin D, Ergocalciferol, (DRISDOL) 1.25 MG (50000 UT) CAPS capsule Take 50,000 Units by mouth every Sunday.   . [DISCONTINUED] levothyroxine (SYNTHROID) 100 MCG tablet Take 100 mcg by mouth daily before breakfast.   No facility-administered encounter medications on file as of 11/25/2018.    ALLERGIES: Allergies  Allergen Reactions  . Septra [Sulfamethoxazole-Trimethoprim] Nausea And Vomiting   VACCINATION STATUS:  There is no immunization history on file for this patient.   HPI    Miranda Mccullough  is a patient with the above medical history. she was diagnosed  with hypothyroidism at approximate age of 30 years when she received I-131 thyroid ablation  for Graves' disease in [place] with subsequent initiation of thyroid hormone replacement.    -  she was given various doses of Synthroid over the years, currently on 100 mcg p.o. daily before breakfast.  She reports compliance to this medication.   She denies palpitations, tremors, heat intolerance at this time.   She has family history of thyroid dysfunction in her siblings and maternal aunts.    -He denies weight gain, constipation, cold intolerance. -Her most recent thyroid function tests are consistent with appropriate replacement.   Pt denies feeling nodules in neck, hoarseness, dysphagia/odynophagia, SOB with lying down.  No recent use of iodine supplements.  I reviewed her chart and she also has a history of type 2 diabetes well controlled on Janumet.  Her most recent A1c is 6.8%.   ROS: Limited as above.  Physical Exam: LMP 06/02/2014  Wt Readings from Last 3 Encounters:  03/29/18 165 lb (74.8 kg)  12/09/14 182 lb (82.6 kg)     ASSESSMENT: 1. Hypothyroidism -RAI induced  PLAN:  -Her previsit thyroid function tests are consistent with appropriate replacement.  She is advised to continue her current dose of Synthroid 100 mcg p.o. daily before breakfast.  - We discussed about the correct intake of her thyroid hormone, on empty stomach at fasting, with water, separated by at least 30 minutes from breakfast and other medications,  and separated by more than 4 hours from calcium, iron, multivitamins, acid reflux medications (PPIs). -Patient is made aware of the fact that thyroid hormone replacement is needed for life, dose to be adjusted by periodic monitoring of thyroid function tests.  -Due to absence of clinical goiter, no need for thyroid ultrasound.    Return in about 6 months (around 05/25/2019) for Follow up with Pre-visit Labs.   He wishes to follow-up for her diabetes with her PMD.  Her recent labs show A1c of 6.8%.   Time for this visit: 15 minutes. Miranda Nimrod  participated in the discussions, expressed understanding, and voiced agreement with the above plans.  All questions were answered to her  satisfaction. she is encouraged to contact clinic should she have any questions or concerns prior to her return visit.  Glade Lloyd, MD [facility] [address] [place], [place] [address] Phone: [phone]  Fax: [phone]   11/25/2018, 11:56 AM  This note was partially dictated with voice recognition software. Similar sounding words can be transcribed inadequately or may not  be corrected upon review.

## 2018-12-14 ENCOUNTER — Other Ambulatory Visit: Payer: Self-pay

## 2018-12-14 MED ORDER — LEVOTHYROXINE SODIUM 100 MCG PO TABS: 100.0000 ug | ORAL_TABLET | Freq: Every day | ORAL | 1 refills | Status: DC

## 2019-03-14 ENCOUNTER — Other Ambulatory Visit (HOSPITAL_COMMUNITY): Payer: Self-pay | Admitting: Emergency Medicine

## 2019-03-14 DIAGNOSIS — Z1231 Encounter for screening mammogram for malignant neoplasm of breast: Secondary | ICD-10-CM

## 2019-05-04 ENCOUNTER — Other Ambulatory Visit: Payer: Self-pay

## 2019-05-04 ENCOUNTER — Ambulatory Visit (HOSPITAL_COMMUNITY)
Admission: RE | Admit: 2019-05-04 | Discharge: 2019-05-04 | Disposition: A | Discharge status: Home / Self Care | Source: Ambulatory Visit | Attending: Emergency Medicine | Admitting: Emergency Medicine

## 2019-05-04 DIAGNOSIS — Z1231 Encounter for screening mammogram for malignant neoplasm of breast: Secondary | ICD-10-CM | POA: Diagnosis present

## 2019-05-26 ENCOUNTER — Other Ambulatory Visit: Payer: Self-pay

## 2019-05-26 DIAGNOSIS — E890 Postprocedural hypothyroidism: Secondary | ICD-10-CM

## 2019-05-27 LAB — T4, FREE: Free T4: 1.4 ng/dL (ref 0.8–1.8)

## 2019-05-27 LAB — TSH: TSH: 2.4 mIU/L (ref 0.40–4.50)

## 2019-05-31 ENCOUNTER — Encounter: Payer: Self-pay | Admitting: "Endocrinology

## 2019-05-31 ENCOUNTER — Ambulatory Visit (INDEPENDENT_AMBULATORY_CARE_PROVIDER_SITE_OTHER): Admitting: "Endocrinology

## 2019-05-31 DIAGNOSIS — E890 Postprocedural hypothyroidism: Secondary | ICD-10-CM

## 2019-05-31 DIAGNOSIS — E1165 Type 2 diabetes mellitus with hyperglycemia: Secondary | ICD-10-CM | POA: Insufficient documentation

## 2019-05-31 NOTE — Patient Instructions (Signed)

## 2019-05-31 NOTE — Progress Notes (Signed)
05/31/2019, 11:23 AM                                    Endocrinology Telehealth Visit Follow up Note -During COVID -19 Pandemic  I connected with Miranda Mccullough on 05/31/2019   by telephone and verified that I am speaking with the correct person using two identifiers. Miranda Mccullough, November 13, 1960. she has verbally consented to this visit. All issues noted in this document were discussed and addressed. The format was not optimal for physical exam.  Miranda Mccullough is a 59 y.o.-year-old female patient being engaged in telehealth via telephone after she was seen in consultation for hypothyroidism. PMD:  Alisia Ferrari, NP.   Past Medical History:  Diagnosis Date  . Diabetes mellitus without complication (HCC)   . Hyperlipidemia   . Hypertension   . Thyroid disease    Past Surgical History:  Procedure Laterality Date  . CESAREAN SECTION    . COLONOSCOPY N/A 11/05/2018   Procedure: COLONOSCOPY;  Surgeon: West Bali, MD;  [facility];  Service: Endoscopy;  Laterality: N/A;  10:30   Social History   Socioeconomic History  . Marital status: Married    Spouse name: Not on file  . Number of children: Not on file  . Years of education: Not on file  . Highest education level: Not on file  Occupational History  . Not on file  Tobacco Use  . Smoking status: Never Smoker  . Smokeless tobacco: Never Used  Substance and Sexual Activity  . Alcohol use: Yes    Alcohol/week: 1.0 - 2.0 standard drinks    Types: 1 - 2 Standard drinks or equivalent per week  . Drug use: No  . Sexual activity: Not on file  Other Topics Concern  . Not on file  Social History Narrative  . Not on file   Social Determinants of Health   Financial Resource Strain:   . Difficulty of Paying Living Expenses:   Food Insecurity:   . Worried About Programme researcher, broadcasting/film/video in the Last Year:   .  Barista in the Last Year:   Transportation Needs:   . Freight forwarder (Medical):   Marland Kitchen Lack of Transportation (Non-Medical):   Physical Activity:   . Days of Exercise per Week:   . Minutes of Exercise per Session:   Stress:   . Feeling of Stress :   Social Connections:   . Frequency of Communication with Friends and Family:   . Frequency of Social Gatherings with Friends and Family:   . Attends Religious Services:   . Active Member of Clubs or Organizations:   . Attends Banker Meetings:   Marland Kitchen Marital Status:    Outpatient Encounter Medications as of 05/31/2019  Medication Sig  . amLODipine (NORVASC) 10 MG tablet Take 10 mg by mouth 2 (two) times daily.  Marland Kitchen BLACK CURRANT SEED OIL PO Take 1 capsule by mouth daily.  . cetirizine (ZYRTEC) 10 MG tablet Take 10 mg by mouth daily as needed for allergies.   Marland Kitchen levothyroxine (SYNTHROID) 100 MCG tablet Take 1 tablet (100 mcg total) by mouth daily before breakfast.  . Polyethyl Glycol-Propyl Glycol (SYSTANE OP) Place 1 drop into both eyes daily as needed (dry eyes).  . rosuvastatin (CRESTOR) 40 MG tablet Take 40 mg by mouth at bedtime.   . SitaGLIPtin-MetFORMIN HCl (JANUMET XR) 50-1000 MG TB24 Take 1 tablet by mouth 2 (two) times daily.  Marland Kitchen telmisartan-hydrochlorothiazide (MICARDIS HCT) 80-25 MG tablet Take 1 tablet by mouth daily.  . TURMERIC PO Take 1 capsule by mouth daily.  . Vitamin D, Ergocalciferol, (DRISDOL) 1.25 MG (50000 UT) CAPS capsule Take 50,000 Units by mouth every Sunday.    No facility-administered encounter medications on file as of 05/31/2019.   ALLERGIES: Allergies  Allergen Reactions  . Septra [Sulfamethoxazole-Trimethoprim] Nausea And Vomiting   VACCINATION STATUS:  There is no immunization history on file for this patient.   HPI    Miranda Mccullough  is a patient with the above medical history. she was diagnosed  with hypothyroidism at approximate age of 30 years when she received I-131  thyroid ablation for Graves' disease in [place] with subsequent initiation of thyroid hormone replacement.    -  she was given various doses of Synthroid over the years, currently on 100 mcg p.o. daily before breakfast.  She reports compliance to this medication.  She is being engaged in telehealth for follow-up.  He denies any new complaints today.   -Her most recent thyroid function tests are consistent with appropriate replacement.   Pt denies feeling nodules in neck, hoarseness, dysphagia/odynophagia, SOB with lying down.  No recent use of iodine supplements.  I reviewed her chart and she also has a history of type 2 diabetes well controlled on Janumet.  Her most recent A1c is 6.8%, she did not have subsequent A1c measurements.   ROS: Limited as above.  Physical Exam: LMP 06/02/2014  Wt Readings from Last 3 Encounters:  03/29/18 165 lb (74.8 kg)  12/09/14 182 lb (82.6 kg)     ASSESSMENT: 1. Hypothyroidism -RAI induced 2.  Type 2 diabetes-on Janumet 50/1000 mg p.o. daily , with most recent A1c of 6.8%  PLAN:  -Her previsit thyroid function tests are consistent with appropriate replacement.  She is advised to continue Synthroid 100 mcg p.o. daily before breakfast.    - We discussed about the correct intake of her thyroid hormone, on empty stomach at fasting, with water, separated by at least 30 minutes from breakfast and other medications,  and separated by more than 4 hours from calcium, iron, multivitamins, acid reflux medications (PPIs). -Patient is made aware of the fact that thyroid hormone replacement is needed for life, dose to be adjusted by periodic monitoring of thyroid function tests. -Due to absence of clinical goiter, no need for thyroid ultrasound.   Regarding her type 2 diabetes, she is advised to stay on Janumet 50/1000 mg p.o. daily.  She will have CMP, lipid panel prior to her next visit.  She will have A1c during her next visit.   Return in about 3 months  (around 08/31/2019) for Follow up with Pre-visit Labs, Next Visit A1c in Office.      - Time spent on this  patient care encounter:  25 minutes of which 50% was spent in  counseling and the rest reviewing  her current and  previous labs / studies and medications  doses and developing a plan for long term care. Cindra Eves  participated in the discussions, expressed understanding, and voiced agreement with the above plans.  All questions were answered to her satisfaction. she is encouraged to contact clinic should she have any questions or concerns prior to her return visit.   Glade Lloyd, MD [facility] [address] [place], [place] [address] Phone: [phone]  Fax: [phone]   05/31/2019, 11:23 AM  This note was partially dictated with voice recognition software. Similar sounding words can be transcribed inadequately or may not  be corrected upon review.

## 2019-08-04 ENCOUNTER — Other Ambulatory Visit: Payer: Self-pay

## 2019-08-04 ENCOUNTER — Encounter: Attending: "Endocrinology | Admitting: Nutrition

## 2019-08-04 VITALS — Ht 63.5 in | Wt 172.4 lb

## 2019-08-04 DIAGNOSIS — E1165 Type 2 diabetes mellitus with hyperglycemia: Secondary | ICD-10-CM

## 2019-08-04 NOTE — Progress Notes (Signed)
°  Medical Nutrition Therapy:  Appt start time: 1515 end time:  1615   Assessment:  Primary concerns today: Diabetes Type 2,  Wants to like to weigh 150-160's. Sees Dr. Fransico Him for thyroid and DM Type 2. Has had DM x 8 years.  Janumet 2-3 times per week. Suppose to take daily. 50/1000 of Janumet.  Walking every other day for 30 minutes a day. Uses the ellipicial for about 20-30 minutes at times. A1C 6.8%.  Preferred Learning Style:  No preference indicated   Learning Readiness:   Ready  Change in progress   MEDICATIONS:   DIETARY INTAKE:  24-hr recall:  Eats 3 meals per day usually   Usual physical activity: Walks and uses ellipical   Estimated energy needs: 1200  calories 135 g carbohydrates 90 g protein 33 g fat  Progress Towards Goal(s):  In progress.   Nutritional Diagnosis:  NB-1.1 Food and nutrition-related knowledge deficit As related to Diabetes Type 2.  As evidenced by A1C .    Intervention:  Nutrition and Diabetes education provided on My Plate, CHO counting, meal planning, portion sizes, timing of meals, avoiding snacks between meals unless having a low blood sugar, target ranges for A1C and blood sugars, signs/symptoms and treatment of hyper/hypoglycemia, monitoring blood sugars, taking medications as prescribed, benefits of exercising 30 minutes per day and prevention of complications of DM.  Marland KitchenGoals  Follow MY Plate Eat meals on time Avoid snacks Drink only water  Keep working out [employer] daily as prescribed. Check BS in am and occassionally in pm.    Teaching Method Utilized: Visual Auditory Hands on  Handouts given during visit include:  The Plate Method   Meal Plan Card  Diabetes Instructions.   Barriers to learning/adherence to lifestyle change: none  Demonstrated degree of understanding via:  Teach Back   Monitoring/Evaluation:  Dietary intake, exercise, , and body weight in 1 month(s).

## 2019-09-01 NOTE — Patient Instructions (Signed)
Goals  Follow MY Plate Eat meals on time Avoid snacks Drink only water  Keep working out [employer] daily as prescribed. Check BS in am and occassionally in pm.

## 2019-09-09 ENCOUNTER — Ambulatory Visit: Admitting: "Endocrinology

## 2019-09-19 ENCOUNTER — Ambulatory Visit: Admitting: Nutrition

## 2019-09-19 ENCOUNTER — Ambulatory Visit: Admitting: "Endocrinology

## 2019-09-30 ENCOUNTER — Other Ambulatory Visit: Payer: Self-pay | Admitting: "Endocrinology

## 2020-02-08 ENCOUNTER — Other Ambulatory Visit: Payer: Self-pay | Admitting: "Endocrinology

## 2020-06-19 ENCOUNTER — Other Ambulatory Visit (HOSPITAL_COMMUNITY): Payer: Self-pay | Admitting: Family Medicine

## 2020-06-19 DIAGNOSIS — Z1231 Encounter for screening mammogram for malignant neoplasm of breast: Secondary | ICD-10-CM

## 2020-06-25 ENCOUNTER — Ambulatory Visit (HOSPITAL_COMMUNITY)
Admission: RE | Admit: 2020-06-25 | Discharge: 2020-06-25 | Disposition: A | Discharge status: Home / Self Care | Source: Ambulatory Visit | Attending: Family Medicine | Admitting: Family Medicine

## 2020-06-25 DIAGNOSIS — Z1231 Encounter for screening mammogram for malignant neoplasm of breast: Secondary | ICD-10-CM | POA: Insufficient documentation

## 2020-07-25 ENCOUNTER — Other Ambulatory Visit: Payer: Self-pay

## 2020-07-25 ENCOUNTER — Ambulatory Visit (HOSPITAL_COMMUNITY): Attending: Physician Assistant

## 2020-07-25 DIAGNOSIS — M25511 Pain in right shoulder: Secondary | ICD-10-CM | POA: Insufficient documentation

## 2020-07-25 DIAGNOSIS — M6281 Muscle weakness (generalized): Secondary | ICD-10-CM | POA: Insufficient documentation

## 2020-07-25 DIAGNOSIS — R29898 Other symptoms and signs involving the musculoskeletal system: Secondary | ICD-10-CM | POA: Insufficient documentation

## 2020-07-25 DIAGNOSIS — G8929 Other chronic pain: Secondary | ICD-10-CM | POA: Diagnosis present

## 2020-07-25 NOTE — Patient Instructions (Signed)
Access Code: [email or web address] URL: [email or web address] Date: 07/25/2020 Prepared by: Shary Decamp  Exercises Seated Scapular Retraction - 1 x daily - 7 x weekly - 3 sets - 10 reps Shoulder External Rotation and Scapular Retraction with Resistance - 1 x daily - 7 x weekly - 3 sets - 10 reps Standing Shoulder Horizontal Abduction with Resistance - 1 x daily - 7 x weekly - 3 sets - 10 reps Corner Pec Major Stretch - 1 x daily - 7 x weekly - 3 sets - 3 reps - 3-60 sec hold

## 2020-07-25 NOTE — Therapy (Signed)
[facility] [address] [place], [place], [address] Phone: [phone]   Fax:  [phone]  Physical Therapy Evaluation  Patient Details  Name: Miranda Mccullough MRN: [id number] Date of Birth: [birth year] Referring Provider (PT): Darr, Celine Mans   Encounter Date: 07/25/2020   PT End of Session - 07/25/20 1430    Visit Number 1    Number of Visits 4    Date for PT Re-Evaluation 08/22/20    Authorization Type [employer] [place] for [employer], no auth, visit limit on medical necessity    PT Start Time 1345    PT Stop Time 1430    PT Time Calculation (min) 45 min    Activity Tolerance Patient tolerated treatment well    Behavior During Therapy [facility] for tasks assessed/performed           Past Medical History:  Diagnosis Date  . Diabetes mellitus without complication (HCC)   . Hyperlipidemia   . Hypertension   . Thyroid disease     Past Surgical History:  Procedure Laterality Date  . CESAREAN SECTION    . COLONOSCOPY N/A 11/05/2018   Procedure: COLONOSCOPY;  Surgeon: West Bali, MD;  [facility];  Service: Endoscopy;  Laterality: N/A;  10:30    There were no vitals filed for this visit.    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 Pt has been using meloxicam and has had some relief from this intervention    Currently in Pain? 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  No    Is the patient reluctant to leave their home because of a fear of falling?  No      Home Tourist information centre manager residence      Prior Function   Level of Independence Independent    Scientist, forensic work;Part time employment    Water engineer      Observation/Other Assessments   Focus on Therapeutic Outcomes (FOTO)  72.7% function      Posture/Postural Control   Posture/Postural Control Postural limitations    Postural Limitations Rounded Shoulders    Posture Comments 3-fingers breadth from posterior shoulder to exam table      ROM / Strength   AROM / PROM / Strength AROM;Strength      AROM   Overall AROM  Deficits    Overall AROM Comments pain with overhead end-range right shoulder motions. No pain provocation with c-spine ROM    AROM Assessment Site Cervical    Cervical Flexion WNL    Cervical Extension WNL    Cervical - Right Side Bend  WNL    Cervical - Left Side Bend WNL    Cervical - Right Rotation WNL    Cervical - Left Rotation WNL      Palpation   Palpation comment tenderness to palpation long head right biceps      Special Tests    Special Tests Rotator Cuff Impingement;Biceps/Labral Tests    Rotator Cuff Impingment tests Neer impingement test;Empty Can test    Biceps/Labral tests Speeds Test      Neer Impingement test    Findings Positive    Side Right      Empty Can test   Findings Positive    Side Right      Speeds test   findings Positive    Side Right                      Objective measurements completed on examination: See above findings.       [facility] Adult PT Treatment/Exercise - 07/25/20 0001      Exercises   Exercises Shoulder      Shoulder Exercises: Seated   Retraction Strengthening;20 reps    Horizontal ABduction Strengthening;Both;20 reps;Theraband    Theraband Level  (Shoulder Horizontal ABduction) Level 3 (Green)    External Rotation Strengthening;Both;20 reps    Theraband Level (Shoulder External Rotation) Level 3 (Green)      Shoulder Exercises: Stretch   Corner Stretch 2 reps;30 seconds                  PT Education - 07/25/20 1534    Education Details education on shoulder impingement vs cervical spine and education on monitoring symptoms to help identify underlying cause of RUE pain    Person(s) Educated Patient    Methods Explanation;Handout    Comprehension Verbalized understanding            PT Short Term Goals - 07/25/20 1538      PT SHORT TERM GOAL #1   Title Patient will be independent with HEP in order to improve functional outcomes.    Time 2    Period Weeks    Status New    Target Date 08/08/20      PT SHORT TERM GOAL #2   Title Patient will report at least 25% improvement in symptoms for improved quality of life.    Time 2    Period Weeks    Status New    Target Date 08/08/20      PT SHORT TERM GOAL #3   Title Demonstrate full, pain-free right shoulder AROM    Baseline painful arc    Time 2    Period Weeks    Status New    Target Date 08/08/20             PT Long Term Goals - 07/25/20 1539      PT LONG TERM GOAL #1   Title Patient will improve FOTO score by at least 5 points in order to indicate improved tolerance to activity.    Baseline 72% function    Time 4    Period Weeks    Status New    Target Date 08/22/20      PT LONG TERM GOAL #2   Title Demonstrate ability to lift 10 lbs overhead without pain    Baseline painful arc right shoulder    Time 4    Period Weeks    Status New    Target Date 08/22/20  Plan - 07/25/20 1535    Clinical Impression Statement Patient is a  59 yo lady presenting to physical therapy with c/o right shoulder pain. She presents with pain limited deficits in right UE strength, ROM, endurance, postural impairments, spinal mobility and  functional mobility with ADL. She is having to modify and restrict ADL as indicated by FOTO score as well as subjective information and objective measures which is affecting overall participation. Patient will benefit from skilled physical therapy in order to improve function and reduce impairment.    Personal Factors and Comorbidities Comorbidity 1;Time since onset of injury/illness/exacerbation    Comorbidities DM    Examination-Activity Limitations Carry;Lift;Reach Overhead    Examination-Participation Restrictions Cleaning;Community Activity;Laundry;Meal Prep    Stability/Clinical Decision Making Stable/Uncomplicated    Clinical Decision Making Low    Rehab Potential Excellent    PT Frequency 1x / week    PT Duration 4 weeks    PT Treatment/Interventions ADLs/Self Care Home Management;Aquatic Therapy;Cryotherapy;Electrical Stimulation;Ultrasound;Traction;Moist Heat;Iontophoresis 4mg /ml Dexamethasone;Gait training;Stair training;Functional mobility training;Therapeutic activities;Therapeutic exercise;Balance training;Patient/family education;Manual techniques;Passive range of motion;Taping;Dry needling;Spinal Manipulations;Joint Manipulations    PT Next Visit Plan right shoulder pain vs c-spine pain? Progress with postural re-education to reduce rounded shoulders    PT Home Exercise Plan scap retraction, ER and horzontal abd with green t-band, corner stretch    Consulted and Agree with Plan of Care Patient           Patient will benefit from skilled therapeutic intervention in order to improve the following deficits and impairments:  Decreased range of motion,Decreased strength,Postural dysfunction,Pain,Improper body mechanics,Impaired UE functional use  Visit Diagnosis: Chronic right shoulder pain  Muscle weakness (generalized)  Other symptoms and signs involving the musculoskeletal system     Problem List Patient Active Problem List   Diagnosis Date Noted  . Uncontrolled type 2  diabetes mellitus with hyperglycemia (HCC) 05/31/2019  . Hypothyroidism following radioiodine therapy 11/25/2018  . Personal history of colonic polyps   . Hypothyroidism 03/29/2018   3:42 PM, 07/25/20 M. 07/27/20, PT, DPT Physical Therapist- [place] Office Number: [phone]  [facility] [address] [place], [place], [place] Phone: [phone]   Fax:  [phone]  Name: Sheneika Walstad MRN: Sharyl Nimrod Date of Birth: [birth year]

## 2020-08-07 ENCOUNTER — Encounter (HOSPITAL_COMMUNITY)

## 2020-08-14 ENCOUNTER — Other Ambulatory Visit: Payer: Self-pay

## 2020-08-14 ENCOUNTER — Encounter (HOSPITAL_COMMUNITY)

## 2020-08-14 ENCOUNTER — Ambulatory Visit (HOSPITAL_COMMUNITY): Attending: Physician Assistant | Admitting: Physical Therapy

## 2020-08-14 ENCOUNTER — Encounter (HOSPITAL_COMMUNITY): Payer: Self-pay | Admitting: Physical Therapy

## 2020-08-14 DIAGNOSIS — G8929 Other chronic pain: Secondary | ICD-10-CM | POA: Diagnosis present

## 2020-08-14 DIAGNOSIS — M25511 Pain in right shoulder: Secondary | ICD-10-CM | POA: Insufficient documentation

## 2020-08-14 DIAGNOSIS — R29898 Other symptoms and signs involving the musculoskeletal system: Secondary | ICD-10-CM | POA: Diagnosis present

## 2020-08-14 DIAGNOSIS — M6281 Muscle weakness (generalized): Secondary | ICD-10-CM | POA: Insufficient documentation

## 2020-08-14 NOTE — Therapy (Signed)
[place], [place], [address] Phone: [phone]   Fax:  [phone]  Physical Therapy Treatment  Patient Details  Name: Miranda Mccullough MRN: [id number] Date of Birth: 1961-02-05 Referring Provider (PT): Darr, Louellen Molder   Encounter Date: 08/14/2020   PT End of Session - 08/14/20 0833     Visit Number 2    Number of Visits 4    Date for PT Re-Evaluation 08/22/20    Authorization Type [employer], no auth, visit limit on medical necessity    PT Start Time [phone]    PT Stop Time 0817    PT Time Calculation (min) 30 min    Activity Tolerance Patient tolerated treatment well    Behavior During Therapy [facility] for tasks assessed/performed             Past Medical History:  Diagnosis Date   Diabetes mellitus without complication ([place])    Hyperlipidemia    Hypertension    Thyroid disease     Past Surgical History:  Procedure Laterality Date   CESAREAN SECTION     COLONOSCOPY N/A 11/05/2018   Procedure: COLONOSCOPY;  Surgeon: Danie Binder, MD;  Location: [facility] ENDO SUITE;  Service: Endoscopy;  Laterality: N/A;  10:30    There were no vitals filed for this visit.       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,    Referring Provider (PT) Darr, Edison Nasuti, PA-C    Hand Dominance Right      Observation/Other Assessments   Focus on Therapeutic Outcomes (FOTO)  87% function      Functional Tests   Functional tests --   able to lift 10# weight overhead with both arms     AROM   Overall AROM Comments WFL of shoulder ROM in all directions and no pain    AROM Assessment Site Cervical    Cervical Flexion WNL    Cervical Extension WNL    Cervical - Right Side Bend WNL    Cervical - Left Side Bend WNL    Cervical - Right Rotation WNL    Cervical - Left Rotation WNL                           [facility]  Adult PT Treatment/Exercise - 08/14/20 0001       Shoulder Exercises: Standing   Protraction AROM;Both;15 reps    Theraband Level (Shoulder Protraction) Level 3 (Green)    Other Standing Exercises shoulder flexion then abd with shoulders set x15 bilaterl                    PT Education - 08/14/20 0833     Education Details on HEP, on FOTO score, on POC    Person(s) Educated Patient    Methods Explanation    Comprehension Verbalized understanding              PT Short Term Goals - 08/14/20 0753       PT SHORT TERM GOAL #1   Title Patient will be independent with HEP in order to improve functional outcomes.    Time 2    Period Weeks    Status Achieved    Target Date 08/08/20      PT SHORT TERM GOAL #2   Title Patient will report at  least 25% improvement in symptoms for improved quality of life.    Baseline 100% better    Time 2    Period Weeks    Status Achieved    Target Date 08/08/20      PT SHORT TERM GOAL #3   Title Demonstrate full, pain-free right shoulder AROM    Baseline painful arc    Time 2    Period Weeks    Status Achieved    Target Date 08/08/20               PT Long Term Goals - 08/14/20 0810       PT LONG TERM GOAL #1   Title Patient will improve FOTO score by at least 5 points in order to indicate improved tolerance to activity.    Baseline 72% function    Time 4    Period Weeks    Status Achieved      PT LONG TERM GOAL #2   Title Demonstrate ability to lift 10 lbs overhead without pain    Baseline painful arc right shoulder    Time 4    Period Weeks    Status Achieved                   Plan - 08/14/20 4174     Clinical Impression Statement all goals met at this time. Reviewed HEP and added 2 new exercises to HEP. Patient to DC from HEP at this time secondary to progress made.    Personal Factors and Comorbidities Comorbidity 1;Time since onset of injury/illness/exacerbation    Comorbidities DM     Examination-Activity Limitations Carry;Lift;Reach Overhead    Examination-Participation Restrictions Cleaning;Community Activity;Laundry;Meal Prep    Stability/Clinical Decision Making Stable/Uncomplicated    Rehab Potential Excellent    PT Frequency 1x / week    PT Duration 4 weeks    PT Treatment/Interventions ADLs/Self Care Home Management;Aquatic Therapy;Cryotherapy;Electrical Stimulation;Ultrasound;Traction;Moist Heat;Iontophoresis 66m/ml Dexamethasone;Gait training;Stair training;Functional mobility training;Therapeutic activities;Therapeutic exercise;Balance training;Patient/family education;Manual techniques;Passive range of motion;Taping;Dry needling;Spinal Manipulations;Joint Manipulations    PT Next Visit Plan DC to HEP    PT Home Exercise Plan scap retraction, ER and horzontal abd with green t-band, corner stretch, serratus punch, shoulder flexion adn then abd with shoulders set    Consulted and Agree with Plan of Care Patient             Patient will benefit from skilled therapeutic intervention in order to improve the following deficits and impairments:  Decreased range of motion, Decreased strength, Postural dysfunction, Pain, Improper body mechanics, Impaired UE functional use  Visit Diagnosis: Chronic right shoulder pain  Muscle weakness (generalized)  Other symptoms and signs involving the musculoskeletal system     Problem List Patient Active Problem List   Diagnosis Date Noted   Uncontrolled type 2 diabetes mellitus with hyperglycemia ([place] 05/31/2019   Hypothyroidism following radioiodine therapy 11/25/2018   Personal history of colonic polyps    Hypothyroidism 03/29/2018   8:34 AM, 08/14/20 MJerene Pitch DPT Physical [facility] [phone]   [id number] [address]Phone: [phone]  Fax:  [phone] Name: Miranda FambroughMRN:  [id number] of Birth: 510-Aug-1962

## 2020-08-21 ENCOUNTER — Encounter (HOSPITAL_COMMUNITY)

## 2020-08-28 ENCOUNTER — Encounter (HOSPITAL_COMMUNITY)

## 2020-09-04 ENCOUNTER — Encounter (HOSPITAL_COMMUNITY): Admitting: Physical Therapy

## 2021-01-29 ENCOUNTER — Ambulatory Visit (INDEPENDENT_AMBULATORY_CARE_PROVIDER_SITE_OTHER): Admitting: Podiatry

## 2021-01-29 ENCOUNTER — Other Ambulatory Visit: Payer: Self-pay

## 2021-01-29 DIAGNOSIS — S90211S Contusion of right great toe with damage to nail, sequela: Secondary | ICD-10-CM

## 2021-01-29 DIAGNOSIS — L603 Nail dystrophy: Secondary | ICD-10-CM | POA: Diagnosis not present

## 2021-01-29 NOTE — Progress Notes (Signed)
  Subjective:  Patient ID: Miranda Mccullough, female    DOB: May 08, 1960,  MRN: [id number]  Chief Complaint  Patient presents with   Nail Problem    R Abnormality of nail of toe     60 y.o. female presents with the above complaint. History confirmed with patient.  She damaged the toenail in May 2022 and the toenail has been growing out but recently started to crack and split  Objective:  Physical Exam: warm, good capillary refill, no trophic changes or ulcerative lesions, normal DP and PT pulses, and normal sensory exam. Left Foot: normal exam, no swelling, tenderness, instability; ligaments intact, full range of motion of all ankle/foot joints Right Foot: Right hallux with transverse Beau's line, splitting of distal lateral corner  Assessment:   1. Contusion of right great toe with damage to nail, sequela   2. Nail dystrophy      Plan:  Patient was evaluated and treated and all questions answered.  Nail that is growing in now appears to be healthy and growing normally.  The nail splitting at the area of the transverse Beau's line at the area of trauma.  I debrided the nail with a nail nipper and rotary bur to smooth it out.  Advised to continue to let it grow out.  If becomes ingrown or causes infection or significant pain would consider partial or total nail avulsion if necessary.  She will return to see me as needed for this or other issues  Return if symptoms worsen or fail to improve.

## 2021-09-24 ENCOUNTER — Emergency Department (HOSPITAL_COMMUNITY)
Admission: EM | Admit: 2021-09-24 | Discharge: 2021-09-24 | Disposition: A | Discharge status: Home / Self Care | Attending: Emergency Medicine | Admitting: Emergency Medicine

## 2021-09-24 ENCOUNTER — Encounter (HOSPITAL_COMMUNITY): Payer: Self-pay

## 2021-09-24 ENCOUNTER — Emergency Department (HOSPITAL_COMMUNITY)

## 2021-09-24 ENCOUNTER — Other Ambulatory Visit: Payer: Self-pay

## 2021-09-24 DIAGNOSIS — R0789 Other chest pain: Secondary | ICD-10-CM | POA: Diagnosis not present

## 2021-09-24 DIAGNOSIS — R079 Chest pain, unspecified: Secondary | ICD-10-CM | POA: Diagnosis present

## 2021-09-24 DIAGNOSIS — Z79899 Other long term (current) drug therapy: Secondary | ICD-10-CM | POA: Insufficient documentation

## 2021-09-24 LAB — CBC WITH DIFFERENTIAL/PLATELET
Abs Immature Granulocytes: 0.01 10*3/uL (ref 0.00–0.07)
Basophils Absolute: 0.1 10*3/uL (ref 0.0–0.1)
Basophils Relative: 2 %
Eosinophils Absolute: 0.5 10*3/uL (ref 0.0–0.5)
Eosinophils Relative: 7 %
HCT: 42.4 % (ref 36.0–46.0)
Hemoglobin: 13.7 g/dL (ref 12.0–15.0)
Immature Granulocytes: 0 %
Lymphocytes Relative: 25 %
Lymphs Abs: 1.7 10*3/uL (ref 0.7–4.0)
MCH: 26.8 pg (ref 26.0–34.0)
MCHC: 32.3 g/dL (ref 30.0–36.0)
MCV: 82.8 fL (ref 80.0–100.0)
Monocytes Absolute: 0.3 10*3/uL (ref 0.1–1.0)
Monocytes Relative: 5 %
Neutro Abs: 4 10*3/uL (ref 1.7–7.7)
Neutrophils Relative %: 61 %
Platelets: 359 10*3/uL (ref 150–400)
RBC: 5.12 MIL/uL — ABNORMAL HIGH (ref 3.87–5.11)
RDW: 13.8 % (ref 11.5–15.5)
WBC: 6.6 10*3/uL (ref 4.0–10.5)
nRBC: 0 % (ref 0.0–0.2)

## 2021-09-24 LAB — COMPREHENSIVE METABOLIC PANEL
ALT: 20 U/L (ref 0–44)
AST: 16 U/L (ref 15–41)
Albumin: 4.2 g/dL (ref 3.5–5.0)
Alkaline Phosphatase: 80 U/L (ref 38–126)
Anion gap: 9 (ref 5–15)
BUN: 17 mg/dL (ref 8–23)
CO2: 27 mmol/L (ref 22–32)
Calcium: 10.1 mg/dL (ref 8.9–10.3)
Chloride: 100 mmol/L (ref 98–111)
Creatinine, Ser: 0.83 mg/dL (ref 0.44–1.00)
GFR, Estimated: >60 mL/min (ref >60)
Glucose, Bld: 277 mg/dL — ABNORMAL HIGH (ref 70–99)
Potassium: 3.6 mmol/L (ref 3.5–5.1)
Sodium: 136 mmol/L (ref 135–145)
Total Bilirubin: 0.9 mg/dL (ref 0.3–1.2)
Total Protein: 7.7 g/dL (ref 6.5–8.1)

## 2021-09-24 LAB — TROPONIN I (HIGH SENSITIVITY)
Troponin I (High Sensitivity): 3 ng/L (ref <18)
Troponin I (High Sensitivity): 4 ng/L (ref <18)

## 2021-09-24 LAB — D-DIMER, QUANTITATIVE: D-Dimer, Quant: <0.27 ug/mL-FEU (ref 0.00–0.50)

## 2021-09-24 MED ORDER — SODIUM CHLORIDE 0.9 % IV SOLN: 10.0000 mL/h | Freq: Once | INTRAVENOUS | Status: DC

## 2021-09-24 NOTE — Discharge Instructions (Signed)
Follow-up with the cardiologist Dr. Wyline Mood or one of his colleagues.  Also follow-up with your family doctor in a week for recheck

## 2021-09-24 NOTE — ED Triage Notes (Signed)
Chest pain that started this am over the rt breast radiates to the back as well. Has had high blood pressure for the last few days that will not decrease

## 2021-09-24 NOTE — ED Provider Notes (Signed)
[facility] EMERGENCY DEPARTMENT Provider Note   CSN: [id number] Arrival date & time: 09/24/21  9417     History  Chief Complaint  Patient presents with   Chest Pain    Miranda Mccullough is a 61 y.o. female.  Patient complains of some chest tightness today.  It is worse with inspiration.  Patient has a history of hypertension diabetes  The history is provided by medical records.  Chest Pain Pain location:  L chest Pain quality: aching   Pain radiates to:  Does not radiate Pain severity:  Moderate Onset quality:  Sudden Progression:  Worsening Chronicity:  New Context: not breathing   Relieved by:  Nothing Worsened by:  Nothing Ineffective treatments:  None tried Associated symptoms: no abdominal pain, no back pain, no cough, no fatigue and no headache        Home Medications Prior to Admission medications   Medication Sig Start Date End Date Taking? Authorizing Provider  amLODipine (NORVASC) 10 MG tablet Take 10 mg by mouth daily.   Yes [provider]  BLACK CURRANT SEED OIL PO Take 1 capsule by mouth daily.   Yes [provider]  cetirizine (ZYRTEC) 10 MG tablet Take 10 mg by mouth daily as needed for allergies.    Yes [provider]  levothyroxine (SYNTHROID) 100 MCG tablet TAKE 1 TABLET DAILY BEFORE BREAKFAST Patient taking differently: Take 100 mcg by mouth daily before breakfast. 09/30/19  Yes Nida, Denman George, MD  Polyethyl Glycol-Propyl Glycol (SYSTANE OP) Place 1 drop into both eyes daily as needed (dry eyes).   Yes [provider]  rosuvastatin (CRESTOR) 40 MG tablet Take 40 mg by mouth at bedtime.    Yes [provider]  SitaGLIPtin-MetFORMIN HCl (JANUMET XR) 50-1000 MG TB24 Take 1 tablet by mouth 2 (two) times daily.   Yes [provider]  telmisartan-hydrochlorothiazide (MICARDIS HCT) 80-25 MG tablet Take 1 tablet by mouth daily.   Yes [provider]  Vitamin D, Ergocalciferol, (DRISDOL)  1.25 MG (50000 UT) CAPS capsule Take 50,000 Units by mouth every Sunday.    Yes [provider]  VITAMIN E PO Take 1 tablet by mouth daily at 6 (six) AM.   Yes [provider]      Allergies    Septra [sulfamethoxazole-trimethoprim]    Review of Systems   Review of Systems  Constitutional:  Negative for appetite change and fatigue.  HENT:  Negative for congestion, ear discharge and sinus pressure.   Eyes:  Negative for discharge.  Respiratory:  Negative for cough.   Cardiovascular:  Positive for chest pain.  Gastrointestinal:  Negative for abdominal pain and diarrhea.  Genitourinary:  Negative for frequency and hematuria.  Musculoskeletal:  Negative for back pain.  Skin:  Negative for rash.  Neurological:  Negative for seizures and headaches.  Psychiatric/Behavioral:  Negative for hallucinations.     Physical Exam Updated Vital Signs BP (!) 144/91   Pulse 64   Temp 98 F (36.7 C) (Oral)   Resp 11   Ht 5\' 4"  (1.626 m)   Wt 77.1 kg   LMP 06/02/2014   SpO2 96%   BMI 29.18 kg/m  Physical Exam Vitals and nursing note reviewed.  Constitutional:      Appearance: She is well-developed.  HENT:     Head: Normocephalic.     Nose: Nose normal.  Eyes:     General: No scleral icterus.    Conjunctiva/sclera: Conjunctivae normal.  Neck:  Thyroid: No thyromegaly.  Cardiovascular:     Rate and Rhythm: Normal rate and regular rhythm.     Heart sounds: No murmur heard.    No friction rub. No gallop.  Pulmonary:     Breath sounds: No stridor. No wheezing or rales.  Chest:     Chest wall: No tenderness.  Abdominal:     General: There is no distension.     Tenderness: There is no abdominal tenderness. There is no rebound.  Musculoskeletal:        General: Normal range of motion.     Cervical back: Neck supple.  Lymphadenopathy:     Cervical: No cervical adenopathy.  Skin:    Findings: No erythema or rash.  Neurological:     Mental Status: She is alert  and oriented to person, place, and time.     Motor: No abnormal muscle tone.     Coordination: Coordination normal.  Psychiatric:        Behavior: Behavior normal.     ED Results / Procedures / Treatments   Labs (all labs ordered are listed, but only abnormal results are displayed) Labs Reviewed  CBC WITH DIFFERENTIAL/PLATELET - Abnormal; Notable for the following components:      Result Value   RBC 5.12 (*)    All other components within normal limits  COMPREHENSIVE METABOLIC PANEL - Abnormal; Notable for the following components:   Glucose, Bld 277 (*)    All other components within normal limits  D-DIMER, QUANTITATIVE  TROPONIN I (HIGH SENSITIVITY)  TROPONIN I (HIGH SENSITIVITY)    EKG EKG Interpretation  Date/Time:  Tuesday September 24 2021 06:55:05 EDT Ventricular Rate:  72 PR Interval:  148 QRS Duration: 79 QT Interval:  440 QTC Calculation: 482 R Axis:   -22 Text Interpretation: Sinus rhythm Borderline left axis deviation Low voltage, precordial leads Consider anterior infarct Baseline wander in lead(s) V3 V6 Confirmed by Bethann Berkshire [phone]) on 09/24/2021 7:46:52 AM  Radiology DG Chest Port 1 View  Result Date: 09/24/2021 CLINICAL DATA:  Right chest pain EXAM: PORTABLE CHEST 1 VIEW COMPARISON:  None Available. FINDINGS: Heart size at the upper limits of normal. Focal consolidation, pleural effusion, or pneumothorax. Aortic calcification. No acute osseous abnormality. IMPRESSION: No acute cardiopulmonary process. Electronically Signed   By: Minerva Fester M.D.   On: 09/24/2021 08:13    Procedures Procedures    Medications Ordered in ED Medications - No data to display  ED Course/ Medical Decision Making/ A&P                           Medical Decision Making Amount and/or Complexity of Data Reviewed Labs: ordered. Radiology: ordered.  This patient presents to the ED for concern of chest pain, this involves an extensive number of treatment options, and is a  complaint that carries with it a high risk of complications and morbidity.  The differential diagnosis includes PE, MI   Co morbidities that complicate the patient evaluation  Hypertension diabetes   Additional history obtained:  Additional history obtained from friend External records from outside source obtained and reviewed including hospital records   Lab Tests:  I Ordered, and personally interpreted labs.  The pertinent results include: CBC and 2 troponins negative along with D-dimer   Imaging Studies ordered:  I ordered imaging studies including chest x-ray I independently visualized and interpreted imaging which showed negative I agree with the radiologist interpretation   Cardiac Monitoring: /  EKG:  The patient was maintained on a cardiac monitor.  I personally viewed and interpreted the cardiac monitored which showed an underlying rhythm of: Normal sinus rhythm   Consultations Obtained:  No consult  Problem List / ED Course / Critical interventions / Medication management  Hypertension chest pain No medicine Reevaluation of the patient after these medicines showed that the patient improved I have reviewed the patients home medicines and have made adjustments as needed   Social Determinants of Health:  None   Test / Admission - Considered:  Stress test as outpatient  Patient with atypical chest discomfort with 2 normal troponins and normal D-dimer.  She is discharged home to follow-up with her primary doctor and also referred to cardiology for possible stress test        Final Clinical Impression(s) / ED Diagnoses Final diagnoses:  Atypical chest pain    Rx / DC Orders ED Discharge Orders     None         Bethann Berkshire, MD 09/24/21 1718

## 2021-12-13 ENCOUNTER — Other Ambulatory Visit (HOSPITAL_COMMUNITY): Payer: Self-pay | Admitting: Family Medicine

## 2021-12-13 DIAGNOSIS — Z1231 Encounter for screening mammogram for malignant neoplasm of breast: Secondary | ICD-10-CM

## 2021-12-16 ENCOUNTER — Ambulatory Visit (HOSPITAL_COMMUNITY)
Admission: RE | Admit: 2021-12-16 | Discharge: 2021-12-16 | Disposition: A | Discharge status: Home / Self Care | Source: Ambulatory Visit | Attending: Family Medicine | Admitting: Family Medicine

## 2021-12-16 DIAGNOSIS — Z1231 Encounter for screening mammogram for malignant neoplasm of breast: Secondary | ICD-10-CM | POA: Diagnosis present

## 2022-01-01 NOTE — Progress Notes (Signed)
CARDIOLOGY CONSULT NOTE       Patient ID: Annarae Macnair MRN: [id number] DOB/AGE: [birth year] 61 y.o.  Admit date: (Not on file) Referring Physician: [facility] Primary Physician: Oneal Grout, FNP Primary Cardiologist: New Reason for Consultation: Chest pain  Active Problems:   * No active hospital problems. *   HPI:  61 y.o. referred by Dr Estell Harpin for chest pain Seen in [facility] ED 09/24/21 CRF include DM, HTN and HLD. Atypical, non exertional pleuritic in nature No associated dyspnea or palpitations R.O negative troponin x 2 CXR NAD ECG 09/25/21 NSR LAD poor R wave progression no acute changes  Given risk factors ER felt she should be seen for consideration of outpatient stress testing  Her sister is Bennetta Laos who I care for She is a retired Runner, broadcasting/film/video. DM poorly controlled with A1c as high as 10 She appears to have neuropathy. She was on ARB but changed to clonidine which makes her sleepy/tired and does not protect her kidneys. She has not had chest pain since ER visit   ROS All other systems reviewed and negative except as noted above  Past Medical History:  Diagnosis Date   Diabetes mellitus without complication ([facility])    Hyperlipidemia    Hypertension    Thyroid disease     Family History  Problem Relation Age of Onset   Diabetes Mother    Hyperlipidemia Mother    Hypertension Mother    Cancer Father    Hyperlipidemia Father    Hypertension Father    Mental illness Father    Heart disease Maternal Grandmother    Mental illness Paternal Grandfather    Breast cancer Neg Hx     Social History   Socioeconomic History   Marital status: Married    Spouse name: Not on file   Number of children: Not on file   Years of education: Not on file   Highest education level: Not on file  Occupational History   Not on file  Tobacco Use   Smoking status: Never   Smokeless tobacco: Never  Vaping Use   Vaping Use: Never used  Substance and Sexual Activity   Alcohol use: Yes     Alcohol/week: 1.0 - 2.0 standard drink of alcohol    Types: 1 - 2 Standard drinks or equivalent per week   Drug use: No   Sexual activity: Not on file  Other Topics Concern   Not on file  Social History Narrative   Not on file   Social Determinants of Health   Financial Resource Strain: Not on file  Food Insecurity: Not on file  Transportation Needs: Not on file  Physical Activity: Not on file  Stress: Not on file  Social Connections: Not on file  Intimate Partner Violence: Not on file    Past Surgical History:  Procedure Laterality Date   CESAREAN SECTION     COLONOSCOPY N/A 11/05/2018   Procedure: COLONOSCOPY;  Surgeon: West Bali, MD;  Location: [facility]O SU[facility];  Service: Endoscopy;  Laterality: N/A;  10:30      Current Outpatient Medications:    amLODipine (NORVASC) 10 MG tablet, Take 10 mg by mouth daily., Disp: , Rfl:    cetirizine (ZYRTEC) 10 MG tablet, Take 10 mg by mouth daily as needed for allergies. , Disp: , Rfl:    cloNIDine (CATAPRES) 0.1 MG tablet, Take 1 tablet by mouth 2 (two) times daily., Disp: , Rfl:    levothyroxine (SYNTHROID) 100 MCG tablet, TAKE  1 TABLET DAILY BEFORE BREAKFAST (Patient taking differently: Take 100 mcg by mouth daily before breakfast.), Disp: 90 tablet, Rfl: 0   Polyethyl Glycol-Propyl Glycol (SYSTANE OP), Place 1 drop into both eyes daily as needed (dry eyes)., Disp: , Rfl:    rosuvastatin (CRESTOR) 40 MG tablet, Take 40 mg by mouth at bedtime. , Disp: , Rfl:    SitaGLIPtin-MetFORMIN HCl (JANUMET XR) 50-1000 MG TB24, Take 1 tablet by mouth 2 (two) times daily., Disp: , Rfl:    Vitamin D, Ergocalciferol, (DRISDOL) 1.25 MG (50000 UT) CAPS capsule, Take 50,000 Units by mouth every Sunday. , Disp: , Rfl:    VITAMIN E PO, Take 1 tablet by mouth daily at 6 (six) AM., Disp: , Rfl:     Physical Exam: Blood pressure (!) 152/90, pulse 71, height 5\' 4"  (1.626 m), weight 162 lb (73.5 kg), last menstrual period 06/02/2014, SpO2 98 %.     Affect appropriate Healthy:  appears stated age 61: normal Neck supple with no adenopathy JVP normal no bruits no thyromegaly Lungs clear with no wheezing and good diaphragmatic motion Heart:  S1/S2 no murmur, no rub, gallop or click PMI normal Abdomen: benighn, BS positve, no tenderness, no AAA no bruit.  No HSM or HJR Distal pulses intact with no bruits No edema Neuro non-focal Skin warm and dry No muscular weakness   Labs:   Lab Results  Component Value Date   WBC 6.6 09/24/2021   HGB 13.7 09/24/2021   HCT 42.4 09/24/2021   MCV 82.8 09/24/2021   PLT 359 09/24/2021   No results for input(s): "NA", "K", "CL", "CO2", "BUN", "CREATININE", "CALCIUM", "PROT", "BILITOT", "ALKPHOS", "ALT", "AST", "GLUCOSE" in the last 168 hours.  Invalid input(s): "LABALBU" No results found for: "CKTOTAL", "CKMB", "CKMBINDEX", "TROPONINI" No results found for: "CHOL" No results found for: "HDL" No results found for: "LDLCALC" No results found for: "TRIG" No results found for: "CHOLHDL" No results found for: "LDLDIRECT"    Radiology: MM 3D SCREEN BREAST BILATERAL  Result Date: 12/18/2021 CLINICAL DATA:  Screening. EXAM: DIGITAL SCREENING BILATERAL MAMMOGRAM WITH TOMOSYNTHESIS AND CAD TECHNIQUE: Bilateral screening digital craniocaudal and mediolateral oblique mammograms were obtained. Bilateral screening digital breast tomosynthesis was performed. The images were evaluated with computer-aided detection. COMPARISON:  Previous exam(s). ACR Breast Density Category b: There are scattered areas of fibroglandular density. FINDINGS: There are no findings suspicious for malignancy. IMPRESSION: No mammographic evidence of malignancy. A result letter of this screening mammogram will be mailed directly to the patient. RECOMMENDATION: Screening mammogram in one year. (Code:SM-B-01Y) BI-RADS CATEGORY  1: Negative. Electronically Signed   By: Lillia Mountain M.D.   On: 12/18/2021 10:17    EKG: See  HPI   ASSESSMENT AND PLAN:   Chest Pain:  atypical, ER visit with r/o shared decision making favor cardiac CTA to further risk stratify. 100 mg lopressor 2 hours before Renal function normal during ER visit but will need to repeat  HTN: Clonidine is a poor long term med. Does not protect kidneys Start Hyzaar 100/12.5 wean clonidine to daily and f/u with primary to d/c   DM:  Discussed low carb diet.  Target hemoglobin A1c is 6.5 or less.  Continue current medications. No on Janumet. Discuss with primary to start seeing endocrine if recent A1c not vastly improved  HLD:  continue statin labs with primary Thyroid: on synthroid replacement TSH with primary    Lopressor 100 mg  Cardiac CTA Wean clonidine Hyzaar 100/12.5 mg daily   F/U [place]  in 6 months   Signed: Charlton Haws 01/10/2022, 8:07 AM

## 2022-01-10 ENCOUNTER — Ambulatory Visit: Attending: Cardiovascular Disease | Admitting: Cardiovascular Disease

## 2022-01-10 ENCOUNTER — Encounter: Payer: Self-pay | Admitting: Cardiovascular Disease

## 2022-01-10 VITALS — BP 152/90 | HR 71 | Ht 64.0 in | Wt 162.0 lb

## 2022-01-10 DIAGNOSIS — R072 Precordial pain: Secondary | ICD-10-CM

## 2022-01-10 DIAGNOSIS — E1165 Type 2 diabetes mellitus with hyperglycemia: Secondary | ICD-10-CM | POA: Diagnosis not present

## 2022-01-10 DIAGNOSIS — E890 Postprocedural hypothyroidism: Secondary | ICD-10-CM | POA: Diagnosis not present

## 2022-01-10 DIAGNOSIS — E782 Mixed hyperlipidemia: Secondary | ICD-10-CM

## 2022-01-10 DIAGNOSIS — I10 Essential (primary) hypertension: Secondary | ICD-10-CM | POA: Diagnosis not present

## 2022-01-10 MED ORDER — METOPROLOL TARTRATE 100 MG PO TABS: ORAL_TABLET | ORAL | 0 refills | Status: AC

## 2022-01-10 MED ORDER — LOSARTAN POTASSIUM-HCTZ 100-12.5 MG PO TABS: 1.0000 | ORAL_TABLET | Freq: Every day | ORAL | 3 refills | Status: AC

## 2022-01-10 NOTE — Patient Instructions (Addendum)
Medication Instructions:  Your physician has recommended you make the following change in your medication:  1-START Losartan/HCTZ (Hyzaar) 100/12.5 mg by mouth daily.   *If you need a refill on your cardiac medications before your next appointment, please call your pharmacy*  Lab Work: Please have your primary care doctor fax [place] a copy of your recent lab work to [phone]. If you have labs (blood work) drawn today and your tests are completely normal, you will receive your results only by: MyChart Message (if you have MyChart) OR A paper copy in the mail If you have any lab test that is abnormal or we need to change your treatment, we will call you to review the results.  Testing/Procedures: Your physician has requested that you have cardiac CT. Cardiac computed tomography (CT) is a painless test that uses an x-ray machine to take clear, detailed pictures of your heart. For further information please visit [email or web address] Please follow instruction sheet as given.  Follow-Up: At [facility], you and your health needs are our priority.  As part of our continuing mission to provide you with exceptional heart care, we have created designated Provider Care Teams.  These Care Teams include your primary Cardiologist (physician) and Advanced Practice Providers (APPs -  Physician Assistants and Nurse Practitioners) who all work together to provide you with the care you need, when you need it.  We recommend signing up for the patient portal called "MyChart".  Sign up information is provided on this After Visit Summary.  MyChart is used to connect with patients for Virtual Visits (Telemedicine).  Patients are able to view lab/test results, encounter notes, upcoming appointments, etc.  Non-urgent messages can be sent to your provider as well.   To learn more about what you can do with MyChart, go to [email or web address].    Your next appointment:   6 months in [place] or  [place]  The format for your next appointment:   In Person  Provider:   Charlton Haws, MD       Your cardiac CT will be scheduled at one of the below locations:   [facility] [address] [place], [place] [address] [phone]  If scheduled at [facility], please arrive at the [facility] and Children's Entrance (Entrance C2) of [facility] 30 minutes prior to test start time. You can use the FREE valet parking offered at entrance C (encouraged to control the heart rate for the test)  Proceed to the [facility] Radiology Department (first floor) to check-in and test prep.  All radiology patients and guests should use entrance C2 at [facility], accessed from [facility], even though the hospital's physical address listed is [address].     Please follow these instructions carefully (unless otherwise directed):  On the Night Before the Test: Be sure to Drink plenty of water. Do not consume any caffeinated/decaffeinated beverages or chocolate 12 hours prior to your test. Do not take any antihistamines 12 hours prior to your test.  On the Day of the Test: Drink plenty of water until 1 hour prior to the test. Do not eat any food 1 hour prior to test. You may take your regular medications prior to the test.  Take metoprolol (Lopressor) 100 mg two hours prior to test. FEMALES- please wear underwire-free bra if available, avoid dresses & tight clothing      After the Test: Drink plenty of water. After receiving IV contrast, you may experience  a mild flushed feeling. This is normal. On occasion, you may experience a mild rash up to 24 hours after the test. This is not dangerous. If this occurs, you can take Benadryl 25 mg and increase your fluid intake. If you experience trouble breathing, this can be serious. If it is severe call 911 IMMEDIATELY. If it is mild, please call our office. If you take any of these  medications: Glipizide/Metformin, Avandament, Glucavance, please do not take 48 hours after completing test unless otherwise instructed.  We will call to schedule your test 2-4 weeks out understanding that some insurance companies will need an authorization prior to the service being performed.   For non-scheduling related questions, please contact the cardiac imaging nurse navigator should you have any questions/concerns: Rockwell Alexandria, Cardiac Imaging Nurse Navigator Larey Brick, Cardiac Imaging Nurse Navigator [facility] Direct Office Dial: [phone]   For scheduling needs, including cancellations and rescheduling, please call [place], [phone].   Important Information About Sugar

## 2022-01-11 ENCOUNTER — Other Ambulatory Visit: Payer: Self-pay | Admitting: Cardiovascular Disease

## 2022-02-03 ENCOUNTER — Telehealth (HOSPITAL_COMMUNITY): Payer: Self-pay | Admitting: *Deleted

## 2022-02-03 NOTE — Telephone Encounter (Signed)
Attempted to call patient regarding upcoming cardiac CT appointment. °Left message on voicemail with name and callback number ° °Rykin Route RN Navigator Cardiac Imaging °[facility] °[phone] Office °[phone] Cell ° °

## 2022-02-04 ENCOUNTER — Ambulatory Visit (HOSPITAL_COMMUNITY)
Admission: RE | Admit: 2022-02-04 | Discharge: 2022-02-04 | Disposition: A | Discharge status: Home / Self Care | Source: Ambulatory Visit | Attending: Cardiovascular Disease | Admitting: Cardiovascular Disease

## 2022-02-04 VITALS — BP 157/100 | HR 68 | Resp 16

## 2022-02-04 DIAGNOSIS — Z01812 Encounter for preprocedural laboratory examination: Secondary | ICD-10-CM | POA: Diagnosis present

## 2022-02-04 DIAGNOSIS — R072 Precordial pain: Secondary | ICD-10-CM | POA: Diagnosis not present

## 2022-02-04 MED ORDER — METOPROLOL TARTRATE 5 MG/5ML IV SOLN
INTRAVENOUS | Status: AC
  Administered 2022-02-04: 5 mg via INTRAVENOUS
  Filled 2022-02-04: qty 10

## 2022-02-04 MED ORDER — IOHEXOL 350 MG/ML SOLN
100.0000 mL | Freq: Once | INTRAVENOUS | Status: AC | PRN
  Administered 2022-02-04: 100 mL via INTRAVENOUS

## 2022-02-04 MED ORDER — NITROGLYCERIN 0.4 MG SL SUBL
SUBLINGUAL_TABLET | SUBLINGUAL | Status: AC
  Filled 2022-02-04: qty 2

## 2022-02-04 MED ORDER — NITROGLYCERIN 0.4 MG SL SUBL
0.8000 mg | SUBLINGUAL_TABLET | Freq: Once | SUBLINGUAL | Status: AC
  Administered 2022-02-04: 0.8 mg via SUBLINGUAL

## 2022-02-04 MED ORDER — METOPROLOL TARTRATE 5 MG/5ML IV SOLN: 5.0000 mg | INTRAVENOUS | Status: DC | PRN

## 2022-05-21 IMAGING — MG MM DIGITAL SCREENING BILAT W/ TOMO AND CAD
8 series · 8 of 24 positions shown · non-contrast
Comparison: Previous exam(s).

CLINICAL DATA: Screening.

EXAM:
DIGITAL SCREENING BILATERAL MAMMOGRAM WITH TOMOSYNTHESIS AND CAD
TECHNIQUE: Bilateral screening digital craniocaudal and mediolateral oblique
mammograms were obtained. Bilateral screening digital breast
tomosynthesis was performed. The images were evaluated with
computer-aided detection.

[R CC synth-2D]
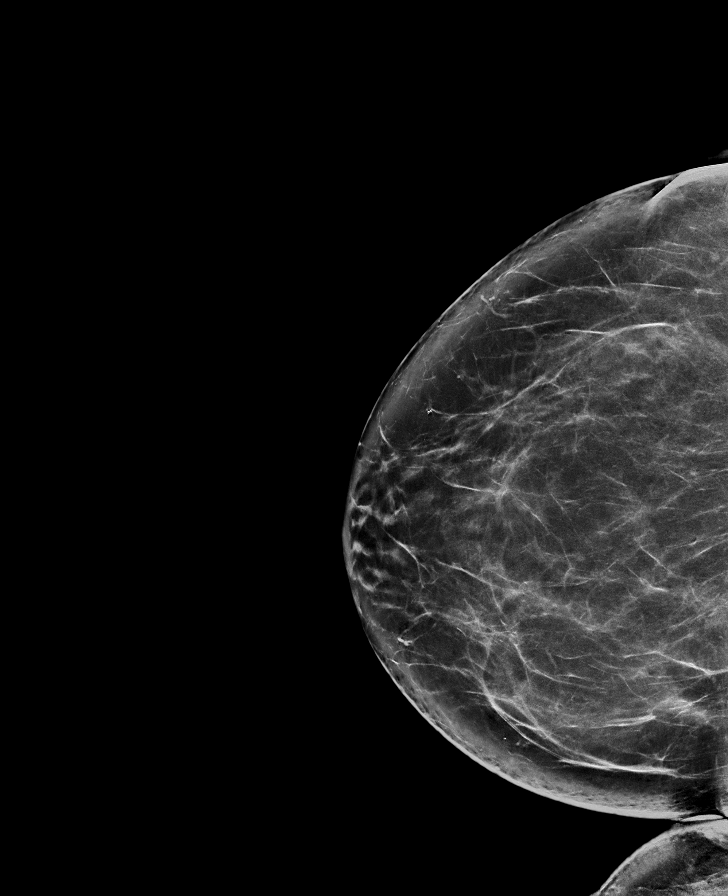

[L MLO synth-2D]
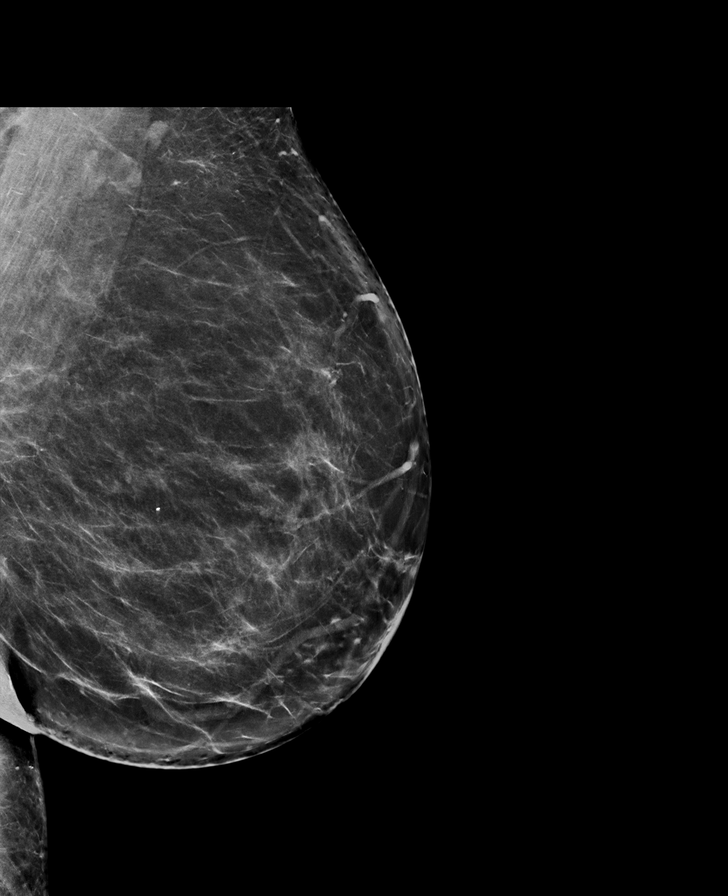

[R MLO synth-2D]
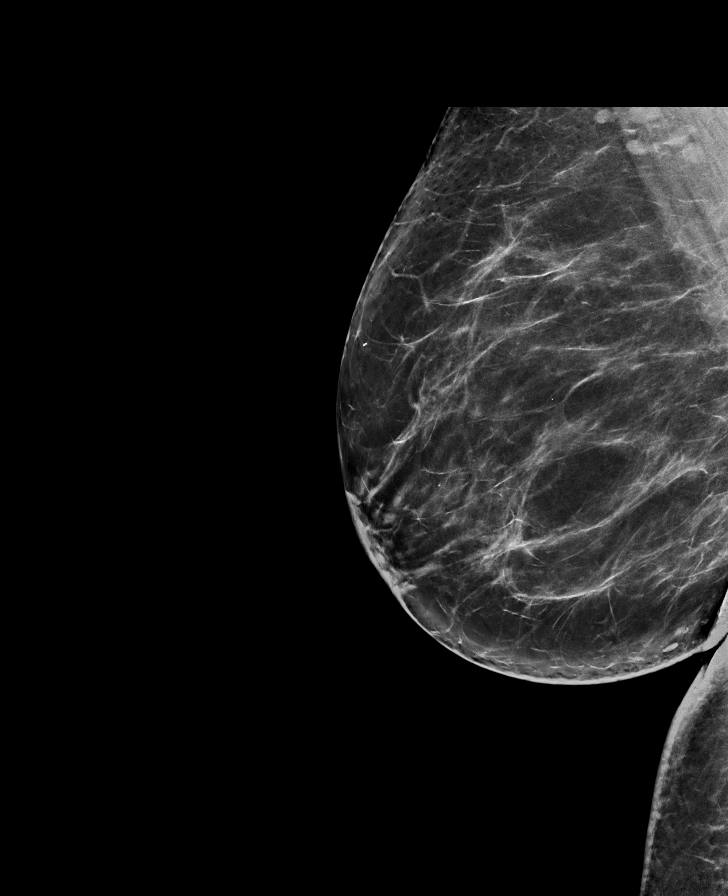

[L CC synth-2D]
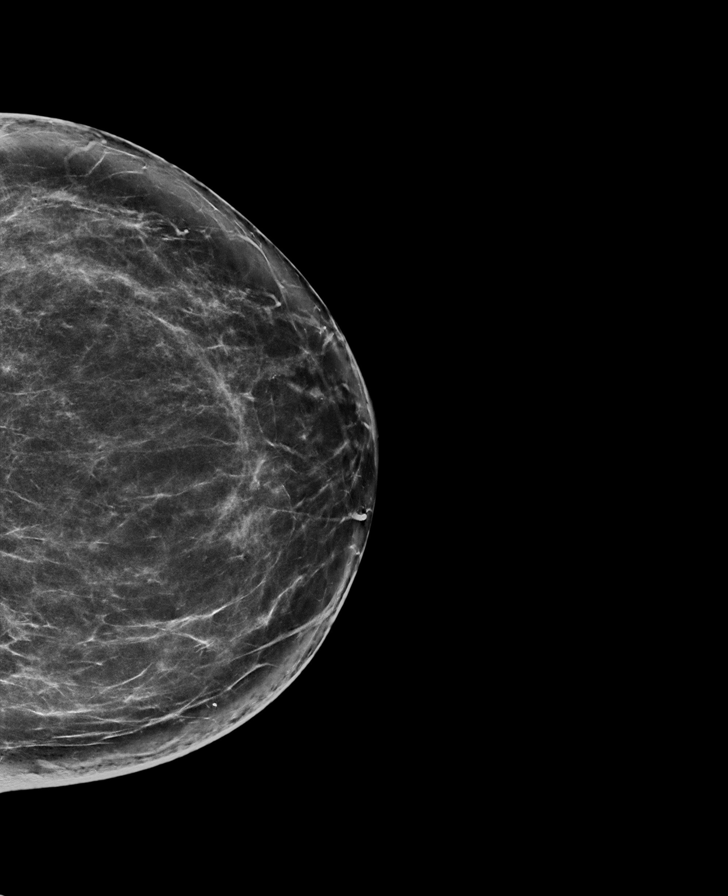

[L MLO tomo · tomo slice 47/92.0]
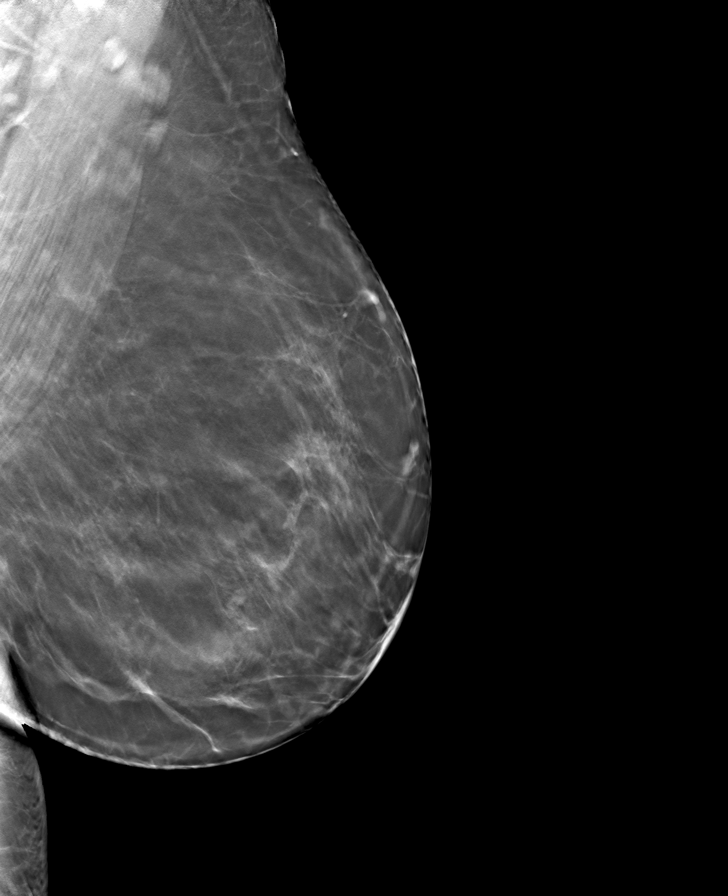

[R CC tomo · tomo slice 44/87.0]
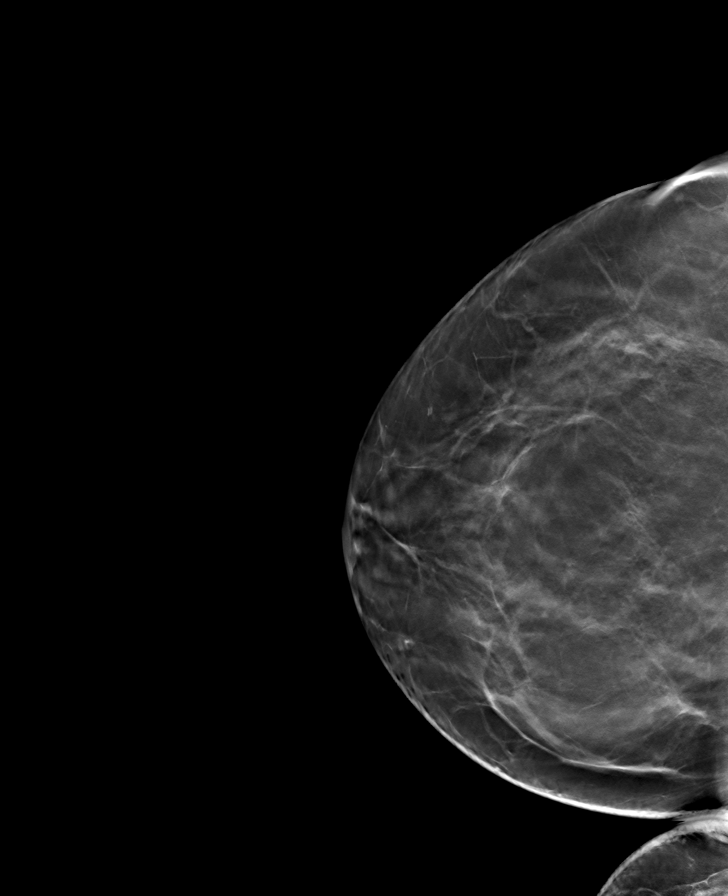

[L CC tomo · tomo slice 43/85.0]
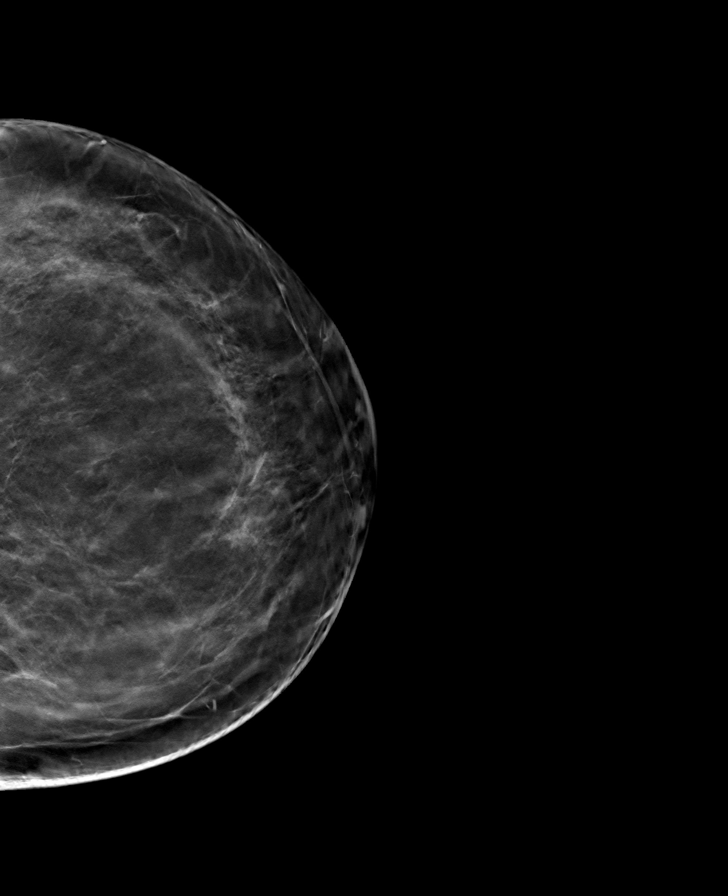

[R MLO tomo · tomo slice 45/89.0]
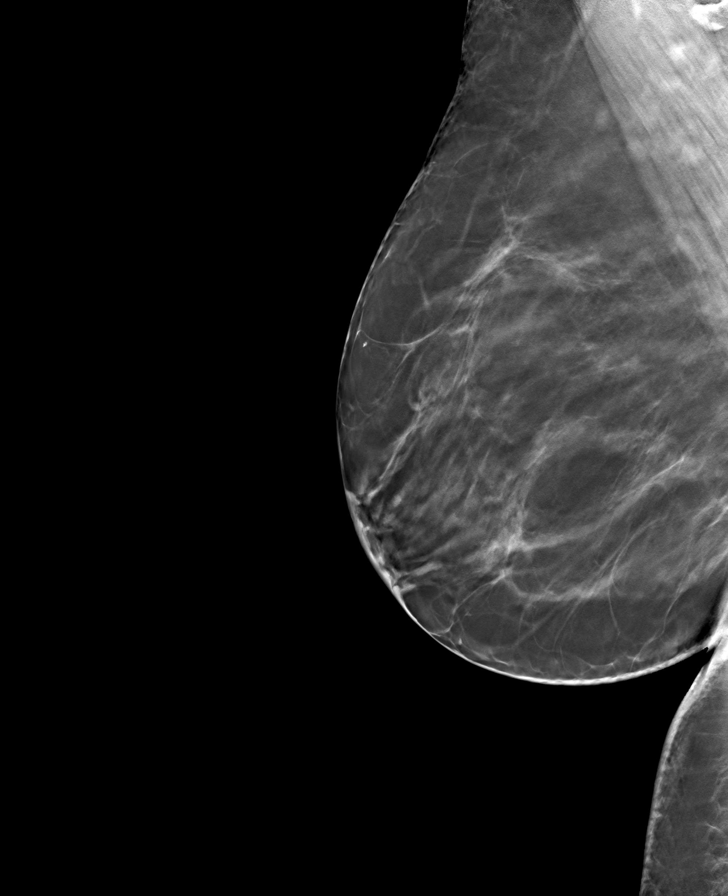

[8 of 24 positions shown; findings below may reference images not displayed]

ACR Breast Density Category b: There are scattered areas of
fibroglandular density.
FINDINGS: There are no findings suspicious for malignancy. The images were
evaluated with computer-aided detection.
IMPRESSION: No mammographic evidence of malignancy. A result letter of this
screening mammogram will be mailed directly to the patient.

RECOMMENDATION:
Screening mammogram in one year. (Code:[id number]-[id number])

BI-RADS CATEGORY  1: Negative.

## 2022-07-14 ENCOUNTER — Ambulatory Visit: Admitting: Cardiovascular Disease

## 2022-07-24 ENCOUNTER — Ambulatory Visit: Admitting: Cardiology

## 2022-12-17 ENCOUNTER — Other Ambulatory Visit (HOSPITAL_COMMUNITY): Payer: Self-pay | Admitting: Family Medicine

## 2022-12-17 DIAGNOSIS — Z1231 Encounter for screening mammogram for malignant neoplasm of breast: Secondary | ICD-10-CM

## 2022-12-24 ENCOUNTER — Inpatient Hospital Stay (HOSPITAL_COMMUNITY): Admission: RE | Admit: 2022-12-24 | Source: Ambulatory Visit

## 2022-12-24 ENCOUNTER — Ambulatory Visit (HOSPITAL_COMMUNITY)
Admission: RE | Admit: 2022-12-24 | Discharge: 2022-12-24 | Disposition: A | Discharge status: Home / Self Care | Source: Ambulatory Visit | Attending: Family Medicine | Admitting: Family Medicine

## 2022-12-24 ENCOUNTER — Encounter (HOSPITAL_COMMUNITY): Payer: Self-pay

## 2022-12-24 DIAGNOSIS — Z1231 Encounter for screening mammogram for malignant neoplasm of breast: Secondary | ICD-10-CM | POA: Diagnosis present

## 2023-02-27 ENCOUNTER — Ambulatory Visit

## 2023-02-28 ENCOUNTER — Telehealth: Admitting: Nurse Practitioner

## 2023-02-28 DIAGNOSIS — J069 Acute upper respiratory infection, unspecified: Secondary | ICD-10-CM

## 2023-02-28 MED ORDER — PROMETHAZINE-DM 6.25-15 MG/5ML PO SYRP: 5.0000 mL | ORAL_SOLUTION | Freq: Four times a day (QID) | ORAL | 0 refills | Status: AC | PRN

## 2023-02-28 NOTE — Patient Instructions (Signed)
Sharyl Nimrod, thank you for joining Claiborne Rigg, NP for today's virtual visit.  While this provider is not your primary care provider (PCP), if your PCP is located in our provider database this encounter information will be shared with them immediately following your visit.   A [facility] MyChart account gives you access to today's visit and all your visits, tests, and labs performed at [facility] " click here if you don't have a [facility] MyChart account or go to [email or web address]  Consent: (Patient) Miranda Mccullough provided verbal consent for this virtual visit at the beginning of the encounter.  Current Medications:  Current Outpatient Medications:    promethazine-dextromethorphan (PROMETHAZINE-DM) 6.25-15 MG/5ML syrup, Take 5 mLs by mouth 4 (four) times daily as needed for cough., Disp: 240 mL, Rfl: 0   amLODipine (NORVASC) 10 MG tablet, Take 10 mg by mouth daily., Disp: , Rfl:    cetirizine (ZYRTEC) 10 MG tablet, Take 10 mg by mouth daily as needed for allergies. , Disp: , Rfl:    cloNIDine (CATAPRES) 0.1 MG tablet, Take 1 tablet by mouth 2 (two) times daily., Disp: , Rfl:    levothyroxine (SYNTHROID) 100 MCG tablet, TAKE 1 TABLET DAILY BEFORE BREAKFAST (Patient taking differently: Take 100 mcg by mouth daily before breakfast.), Disp: 90 tablet, Rfl: 0   losartan-hydrochlorothiazide (HYZAAR) 100-12.5 MG tablet, Take 1 tablet by mouth daily., Disp: 90 tablet, Rfl: 3   metoprolol tartrate (LOPRESSOR) 100 MG tablet, Take 1 tablet by mouth 2 hours prior to CT scan, Disp: 1 tablet, Rfl: 0   Polyethyl Glycol-Propyl Glycol (SYSTANE OP), Place 1 drop into both eyes daily as needed (dry eyes)., Disp: , Rfl:    rosuvastatin (CRESTOR) 40 MG tablet, Take 40 mg by mouth at bedtime. , Disp: , Rfl:    SitaGLIPtin-MetFORMIN HCl (JANUMET XR) 50-1000 MG TB24, Take 1 tablet by mouth 2 (two) times daily., Disp: , Rfl:    Vitamin D, Ergocalciferol, (DRISDOL) 1.25 MG (50000 UT) CAPS  capsule, Take 50,000 Units by mouth every Sunday. , Disp: , Rfl:    VITAMIN E PO, Take 1 tablet by mouth daily at 6 (six) AM., Disp: , Rfl:    Medications ordered in this encounter:  Meds ordered this encounter  Medications   promethazine-dextromethorphan (PROMETHAZINE-DM) 6.25-15 MG/5ML syrup    Sig: Take 5 mLs by mouth 4 (four) times daily as needed for cough.    Dispense:  240 mL    Refill:  0    Supervising Provider:   Merrilee Jansky [[id number]]     *If you need refills on other medications prior to your next appointment, please contact your pharmacy*  Follow-Up: Call back or seek an in-person evaluation if the symptoms worsen or if the condition fails to improve as anticipated.  [facility] [phone]  Other Instructions INSTRUCTIONS: use a humidifier for nasal congestion Drink plenty of fluids, rest and wash hands frequently to avoid the spread of infection Alternate tylenol and Motrin for relief of fever    If you have been instructed to have an in-person evaluation today at a local Urgent Care facility, please use the link below. It will take you to a list of all of our available [facility], including address, phone number and hours of operation. Please do not delay care.  [facility]  If you or a family member do not have a primary care provider, use the link below to schedule a visit and  establish care. When you choose a [facility] primary care physician or advanced practice provider, you gain a long-term partner in health. Find a Primary Care Provider  Learn more about [facility]'s in-office and virtual care options: [facility] - Get Care Now

## 2023-02-28 NOTE — Progress Notes (Signed)
Virtual Visit Consent   Miranda Mccullough, you are scheduled for a virtual visit with a [facility] provider today. Just as with appointments in the office, your consent must be obtained to participate. Your consent will be active for this visit and any virtual visit you may have with one of our providers in the next 365 days. If you have a MyChart account, a copy of this consent can be sent to you electronically.  As this is a virtual visit, video technology does not allow for your provider to perform a traditional examination. This may limit your provider's ability to fully assess your condition. If your provider identifies any concerns that need to be evaluated in person or the need to arrange testing (such as labs, EKG, etc.), we will make arrangements to do so. Although advances in technology are sophisticated, we cannot ensure that it will always work on either your end or our end. If the connection with a video visit is poor, the visit may have to be switched to a telephone visit. With either a video or telephone visit, we are not always able to ensure that we have a secure connection.  By engaging in this virtual visit, you consent to the provision of healthcare and authorize for your insurance to be billed (if applicable) for the services provided during this visit. Depending on your insurance coverage, you may receive a charge related to this service.  I need to obtain your verbal consent now. Are you willing to proceed with your visit today? Miranda Mccullough has provided verbal consent on 02/28/2023 for a virtual visit (video or telephone). Claiborne Rigg, NP  Date: 02/28/2023 2:06 PM  Virtual Visit via Video Note   I, Claiborne Rigg, connected with  Miranda Mccullough  ([id number], 01/04/1961) on 02/28/23 at  2:00 PM EST by a video-enabled telemedicine application and verified that I am speaking with the correct person using two identifiers.  Location: Patient: Virtual Visit Location Patient:  Home Provider: Virtual Visit Location Provider: Home Office   I discussed the limitations of evaluation and management by telemedicine and the availability of in person appointments. The patient expressed understanding and agreed to proceed.    History of Present Illness: Miranda Mccullough is a 62 y.o. who identifies as a female who was assigned female at birth, and is being seen today for URI.  Miranda Mccullough notes persistent cough and symptoms of costochondritis which started 3-4 days ago. Also notes pain the left lower thoracic area of back. Tmax 99 and currently resolved. No tachycardia or cardiac chest pain present. She is currently taking coricidin HBP for her blood pressure. Cough is worse at night. Has not taken a home COVID test States she and her husband were also sick last month but symptoms resolved on their own.   Problems:  Patient Active Problem List   Diagnosis Date Noted   Uncontrolled type 2 diabetes mellitus with hyperglycemia (HCC) 05/31/2019   Hypothyroidism following radioiodine therapy 11/25/2018   History of colonic polyps    Hypothyroidism 03/29/2018    Allergies:  Allergies  Allergen Reactions   Septra [Sulfamethoxazole-Trimethoprim] Nausea And Vomiting   Medications:  Current Outpatient Medications:    promethazine-dextromethorphan (PROMETHAZINE-DM) 6.25-15 MG/5ML syrup, Take 5 mLs by mouth 4 (four) times daily as needed for cough., Disp: 240 mL, Rfl: 0   amLODipine (NORVASC) 10 MG tablet, Take 10 mg by mouth daily., Disp: , Rfl:    cetirizine (ZYRTEC) 10 MG tablet, Take 10 mg by mouth  daily as needed for allergies. , Disp: , Rfl:    cloNIDine (CATAPRES) 0.1 MG tablet, Take 1 tablet by mouth 2 (two) times daily., Disp: , Rfl:    levothyroxine (SYNTHROID) 100 MCG tablet, TAKE 1 TABLET DAILY BEFORE BREAKFAST (Patient taking differently: Take 100 mcg by mouth daily before breakfast.), Disp: 90 tablet, Rfl: 0   losartan-hydrochlorothiazide (HYZAAR) 100-12.5 MG tablet,  Take 1 tablet by mouth daily., Disp: 90 tablet, Rfl: 3   metoprolol tartrate (LOPRESSOR) 100 MG tablet, Take 1 tablet by mouth 2 hours prior to CT scan, Disp: 1 tablet, Rfl: 0   Polyethyl Glycol-Propyl Glycol (SYSTANE OP), Place 1 drop into both eyes daily as needed (dry eyes)., Disp: , Rfl:    rosuvastatin (CRESTOR) 40 MG tablet, Take 40 mg by mouth at bedtime. , Disp: , Rfl:    SitaGLIPtin-MetFORMIN HCl (JANUMET XR) 50-1000 MG TB24, Take 1 tablet by mouth 2 (two) times daily., Disp: , Rfl:    Vitamin D, Ergocalciferol, (DRISDOL) 1.25 MG (50000 UT) CAPS capsule, Take 50,000 Units by mouth every Sunday. , Disp: , Rfl:    VITAMIN E PO, Take 1 tablet by mouth daily at 6 (six) AM., Disp: , Rfl:   Observations/Objective: Patient is well-developed, well-nourished in no acute distress.  Resting comfortably at home.  Head is normocephalic, atraumatic.  No labored breathing.  Speech is clear and coherent with logical content.  Patient is alert and oriented at baseline.    Assessment and Plan: 1. URI with cough and congestion (Primary) - promethazine-dextromethorphan (PROMETHAZINE-DM) 6.25-15 MG/5ML syrup; Take 5 mLs by mouth 4 (four) times daily as needed for cough.  Dispense: 240 mL; Refill: 0  INSTRUCTIONS: use a humidifier for nasal congestion Drink plenty of fluids, rest and wash hands frequently to avoid the spread of infection Alternate tylenol and Motrin for relief of fever   Follow Up Instructions: I discussed the assessment and treatment plan with the patient. The patient was provided an opportunity to ask questions and all were answered. The patient agreed with the plan and demonstrated an understanding of the instructions.  A copy of instructions were sent to the patient via MyChart unless otherwise noted below.    The patient was advised to call back or seek an in-person evaluation if the symptoms worsen or if the condition fails to improve as anticipated.    Claiborne Rigg, NP

## 2023-12-24 ENCOUNTER — Other Ambulatory Visit (HOSPITAL_COMMUNITY): Payer: Self-pay | Admitting: Family Medicine

## 2023-12-24 DIAGNOSIS — Z1231 Encounter for screening mammogram for malignant neoplasm of breast: Secondary | ICD-10-CM

## 2023-12-25 ENCOUNTER — Ambulatory Visit (HOSPITAL_COMMUNITY)
Admission: RE | Admit: 2023-12-25 | Discharge: 2023-12-25 | Disposition: A | Discharge status: Home / Self Care | Source: Ambulatory Visit | Attending: Family Medicine | Admitting: Family Medicine

## 2023-12-25 DIAGNOSIS — Z1231 Encounter for screening mammogram for malignant neoplasm of breast: Secondary | ICD-10-CM | POA: Insufficient documentation

## 2024-03-07 ENCOUNTER — Ambulatory Visit
Admission: RE | Admit: 2024-03-07 | Discharge: 2024-03-07 | Disposition: A | Discharge status: Home / Self Care | Attending: Family Medicine | Admitting: Family Medicine

## 2024-03-07 VITALS — BP 160/84 | HR 92 | Temp 98.9°F | Resp 16

## 2024-03-07 DIAGNOSIS — J069 Acute upper respiratory infection, unspecified: Secondary | ICD-10-CM | POA: Insufficient documentation

## 2024-03-07 DIAGNOSIS — N390 Urinary tract infection, site not specified: Secondary | ICD-10-CM | POA: Diagnosis not present

## 2024-03-07 LAB — POCT URINE DIPSTICK
Bilirubin, UA: NEGATIVE
Blood, UA: NEGATIVE
Glucose, UA: NEGATIVE mg/dL
Ketones, POC UA: NEGATIVE mg/dL
Nitrite, UA: NEGATIVE
POC PROTEIN,UA: NEGATIVE
Spec Grav, UA: 1.015
Urobilinogen, UA: 0.2 U/dL
pH, UA: 6.5

## 2024-03-07 MED ORDER — AZELASTINE HCL 0.1 % NA SOLN: 1.0000 | Freq: Two times a day (BID) | NASAL | 0 refills | Status: AC

## 2024-03-07 MED ORDER — PROMETHAZINE-DM 6.25-15 MG/5ML PO SYRP: 5.0000 mL | ORAL_SOLUTION | Freq: Four times a day (QID) | ORAL | 0 refills | Status: AC | PRN

## 2024-03-07 MED ORDER — CEPHALEXIN 500 MG PO CAPS: 500.0000 mg | ORAL_CAPSULE | Freq: Two times a day (BID) | ORAL | 0 refills | Status: AC

## 2024-03-07 NOTE — Discharge Instructions (Signed)
 Take the course of antibiotics for a possible urinary tract infection, we will let you know if you need to make any changes based on your urine culture once this test is back.  I have sent over a cough syrup and nasal spray to help with your viral respiratory symptoms.  You may also take over-the-counter cold congestion medications such as Coricidin HBP and plain Mucinex.  Return for worsening or unresolving symptoms.

## 2024-03-07 NOTE — ED Triage Notes (Signed)
 Cough, congestion x 4 days. Taking Nyquil. Urinary urgency and frequency, burning/itching with urination x 7 days.

## 2024-03-07 NOTE — ED Provider Notes (Signed)
 " [facility]    CSN: [id number] Arrival date & time: 03/07/24  1042      History   Chief Complaint Chief Complaint  Patient presents with   Cough    Possible UTI - Entered by patient   Urinary Frequency    HPI Miranda Mccullough is a 64 y.o. female.   Patient presenting today with about a week of urinary frequency, urgency, dysuria.  Denies fever, chills, hematuria, bowel changes.  Zofran not trying anything over-the-counter for the symptoms.  Also having 4-day history of congestion, cough.  Denies fever, chills, chest pain, shortness of breath, abdominal pain.  Tried NyQuil with mild temporary benefit and states she is feeling some better today.  No known history of chronic pulmonary disease.    Past Medical History:  Diagnosis Date   Diabetes mellitus without complication (HCC)    Hyperlipidemia    Hypertension    Thyroid disease     Patient Active Problem List   Diagnosis Date Noted   Uncontrolled type 2 diabetes mellitus with hyperglycemia (HCC) 05/31/2019   Hypothyroidism following radioiodine therapy 11/25/2018   History of colonic polyps    Hypothyroidism 03/29/2018    Past Surgical History:  Procedure Laterality Date   CESAREAN SECTION     COLONOSCOPY N/A 11/05/2018   Procedure: COLONOSCOPY;  Surgeon: Harvey Margo CROME, MD;  Location: [facility];  Service: Endoscopy;  Laterality: N/A;  10:30    OB History   No obstetric history on file.      Home Medications    Prior to Admission medications  Medication Sig Start Date End Date Taking? Authorizing Provider  amLODipine (NORVASC) 10 MG tablet Take 10 mg by mouth daily.   Yes [provider]  azelastine  (ASTELIN ) 0.1 % nasal spray Place 1 spray into both nostrils 2 (two) times daily. Use in each nostril as directed 03/07/24  Yes Stuart Vernell Norris, PA-C  cephALEXin  (KEFLEX ) 500 MG capsule Take 1 capsule (500 mg total) by mouth 2 (two) times daily. 03/07/24  Yes Stuart Vernell Norris, PA-C   cetirizine (ZYRTEC) 10 MG tablet Take 10 mg by mouth daily as needed for allergies.    Yes [provider]  levothyroxine  (SYNTHROID ) 100 MCG tablet TAKE 1 TABLET DAILY BEFORE BREAKFAST Patient taking differently: Take 100 mcg by mouth daily before breakfast. 09/30/19  Yes Nida, [name], MD  losartan -hydrochlorothiazide (HYZAAR) 100-12.5 MG tablet Take 1 tablet by mouth daily. 01/10/22  Yes Nishan, Peter C, MD  metoprolol  tartrate (LOPRESSOR ) 100 MG tablet Take 1 tablet by mouth 2 hours prior to CT scan 01/10/22  Yes Delford Maude BROCKS, MD  promethazine -dextromethorphan (PROMETHAZINE -DM) 6.25-15 MG/5ML syrup Take 5 mLs by mouth 4 (four) times daily as needed. 03/07/24  Yes Stuart Vernell Norris, PA-C  rosuvastatin (CRESTOR) 40 MG tablet Take 40 mg by mouth at bedtime.    Yes [provider]  SitaGLIPtin-MetFORMIN HCl (JANUMET XR) 50-1000 MG TB24 Take 1 tablet by mouth 2 (two) times daily.   Yes [provider]  Vitamin D, Ergocalciferol, (DRISDOL) 1.25 MG (50000 UT) CAPS capsule Take 50,000 Units by mouth every Sunday.    Yes [provider]  VITAMIN E PO Take 1 tablet by mouth daily at 6 (six) AM.   Yes [provider]  cloNIDine (CATAPRES) 0.1 MG tablet Take 1 tablet by mouth 2 (two) times daily.    [provider]  Polyethyl Glycol-Propyl Glycol (SYSTANE OP) Place 1 drop into both eyes daily as  needed (dry eyes).    [provider]  promethazine -dextromethorphan (PROMETHAZINE -DM) 6.25-15 MG/5ML syrup Take 5 mLs by mouth 4 (four) times daily as needed for cough. 02/28/23   Theotis Haze ORN, NP    Family History Family History  Problem Relation Age of Onset   Diabetes Mother    Hyperlipidemia Mother    Hypertension Mother    Cancer Father    Hyperlipidemia Father    Hypertension Father    Mental illness Father    Heart disease Maternal Grandmother    Mental illness Paternal Grandfather    Breast cancer Neg Hx      Social History Social History[1]   Allergies   Septra [sulfamethoxazole-trimethoprim]   Review of Systems Review of Systems PER HPI  Physical Exam Triage Vital Signs ED Triage Vitals  Encounter Vitals Group     BP 03/07/24 1105 (!) 160/84     Girls Systolic BP Percentile --      Girls Diastolic BP Percentile --      Boys Systolic BP Percentile --      Boys Diastolic BP Percentile --      Pulse Rate 03/07/24 1105 92     Resp 03/07/24 1105 16     Temp 03/07/24 1105 98.9 F (37.2 C)     Temp Source 03/07/24 1105 Oral     SpO2 03/07/24 1105 94 %     Weight --      Height --      Head Circumference --      Peak Flow --      Pain Score 03/07/24 1108 0     Pain Loc --      Pain Education --      Exclude from Growth Chart --    No data found.  Updated Vital Signs BP (!) 160/84 (BP Location: Right Arm)   Pulse 92   Temp 98.9 F (37.2 C) (Oral)   Resp 16   LMP 06/02/2014   SpO2 94%   Visual Acuity Right Eye Distance:   Left Eye Distance:   Bilateral Distance:    Right Eye Near:   Left Eye Near:    Bilateral Near:     Physical Exam Vitals and nursing note reviewed.  Constitutional:      Appearance: Normal appearance.  HENT:     Head: Atraumatic.     Right Ear: Tympanic membrane and external ear normal.     Left Ear: Tympanic membrane and external ear normal.     Nose: Rhinorrhea present.     Mouth/Throat:     Mouth: Mucous membranes are moist.     Pharynx: Posterior oropharyngeal erythema present.  Eyes:     Extraocular Movements: Extraocular movements intact.     Conjunctiva/sclera: Conjunctivae normal.  Cardiovascular:     Rate and Rhythm: Normal rate and regular rhythm.     Heart sounds: Normal heart sounds.  Pulmonary:     Effort: Pulmonary effort is normal.     Breath sounds: Normal breath sounds. No wheezing or rales.  Abdominal:     General: Bowel sounds are normal. There is no distension.     Palpations: Abdomen is soft.      Tenderness: There is no abdominal tenderness. There is no right CVA tenderness, left CVA tenderness or guarding.  Musculoskeletal:        General: Normal range of motion.     Cervical back: Normal range of motion and neck supple.  Skin:  General: Skin is warm and dry.  Neurological:     Mental Status: She is alert and oriented to person, place, and time.  Psychiatric:        Mood and Affect: Mood normal.        Thought Content: Thought content normal.      UC Treatments / Results  Labs (all labs ordered are listed, but only abnormal results are displayed) Labs Reviewed  POCT URINE DIPSTICK - Abnormal; Notable for the following components:      Result Value   Leukocytes, UA Trace (*)    All other components within normal limits  URINE CULTURE    EKG   Radiology No results found.  Procedures Procedures (including critical care time)  Medications Ordered in UC Medications - No data to display  Initial Impression / Assessment and Plan / UC Course  I have reviewed the triage vital signs and the nursing notes.  Pertinent labs & imaging results that were available during my care of the patient were reviewed by me and considered in my medical decision making (see chart for details).     Hypertensive in triage, otherwise vital signs reassuring.  Suspect viral respiratory infection, treat with Astelin , Phenergan  DM, supportive over-the-counter medications and home care.  Does have trace leuks and urinalysis today given urinary symptoms we will start Keflex  while awaiting urine culture for further evaluation.  Good hydration and return precautions reviewed.  Final Clinical Impressions(s) / UC Diagnoses   Final diagnoses:  Acute lower UTI  Viral URI with cough     Discharge Instructions      Take the course of antibiotics for a possible urinary tract infection, we will let you know if you need to make any changes based on your urine culture once this test is back.  I  have sent over a cough syrup and nasal spray to help with your viral respiratory symptoms.  You may also take over-the-counter cold congestion medications such as Coricidin HBP and plain Mucinex.  Return for worsening or unresolving symptoms.    ED Prescriptions     Medication Sig Dispense Auth. Provider   cephALEXin  (KEFLEX ) 500 MG capsule Take 1 capsule (500 mg total) by mouth 2 (two) times daily. 10 capsule Stuart Vernell Norris, PA-C   azelastine  (ASTELIN ) 0.1 % nasal spray Place 1 spray into both nostrils 2 (two) times daily. Use in each nostril as directed 30 mL Stuart Vernell Norris, PA-C   promethazine -dextromethorphan (PROMETHAZINE -DM) 6.25-15 MG/5ML syrup Take 5 mLs by mouth 4 (four) times daily as needed. 100 mL Stuart Vernell Norris, [place]      PDMP not reviewed this encounter.    [1]  Social History Tobacco Use   Smoking status: Never   Smokeless tobacco: Never  Vaping Use   Vaping status: Never Used  Substance Use Topics   Alcohol use: Yes    Alcohol/week: 1.0 - 2.0 standard drink of alcohol    Types: 1 - 2 Standard drinks or equivalent per week   Drug use: No     Stuart Vernell Norris, PA-C 03/07/24 1231  "

## 2024-03-10 ENCOUNTER — Ambulatory Visit (HOSPITAL_COMMUNITY): Payer: Self-pay

## 2024-03-10 LAB — URINE CULTURE: Culture: >=100000 — AB

## 2024-03-10 MED ORDER — NITROFURANTOIN MONOHYD MACRO 100 MG PO CAPS: 100.0000 mg | ORAL_CAPSULE | Freq: Two times a day (BID) | ORAL | 0 refills | Status: AC

## 2024-03-10 NOTE — Telephone Encounter (Signed)
 Discontinue Keflex , begin Macrobid .  Prescription sent to [facility] in [place].

## 2024-04-08 ENCOUNTER — Encounter (INDEPENDENT_AMBULATORY_CARE_PROVIDER_SITE_OTHER): Payer: Self-pay | Admitting: *Deleted
